# Patient Record
Sex: Female | Born: 1970 | Race: White | Hispanic: No | Marital: Married
Health system: Southern US, Academic
[De-identification: ages and names within clinical notes are randomized; demographics above are authoritative.]

---

## 1997-07-10 ENCOUNTER — Other Ambulatory Visit (HOSPITAL_COMMUNITY): Payer: Self-pay

## 2015-10-21 ENCOUNTER — Inpatient Hospital Stay (HOSPITAL_COMMUNITY)
Admit: 2015-10-21 | Discharge: 2015-10-21 | Disposition: A | Discharge status: Home / Self Care | Payer: Self-pay | Admitting: Radiology

## 2018-01-02 ENCOUNTER — Other Ambulatory Visit (HOSPITAL_COMMUNITY): Payer: Self-pay

## 2018-01-02 DIAGNOSIS — Z1231 Encounter for screening mammogram for malignant neoplasm of breast: Secondary | ICD-10-CM

## 2018-01-15 ENCOUNTER — Other Ambulatory Visit (HOSPITAL_COMMUNITY): Payer: Self-pay | Admitting: NURSE PRACTITIONER

## 2018-01-15 ENCOUNTER — Ambulatory Visit
Admission: RE | Admit: 2018-01-15 | Discharge: 2018-01-15 | Disposition: A | Discharge status: Home / Self Care | Payer: Medicare (Managed Care) | Source: Ambulatory Visit | Attending: NURSE PRACTITIONER | Admitting: NURSE PRACTITIONER

## 2018-01-15 ENCOUNTER — Encounter (HOSPITAL_BASED_OUTPATIENT_CLINIC_OR_DEPARTMENT_OTHER): Payer: Self-pay

## 2018-01-15 DIAGNOSIS — Z1231 Encounter for screening mammogram for malignant neoplasm of breast: Secondary | ICD-10-CM | POA: Insufficient documentation

## 2018-02-05 ENCOUNTER — Other Ambulatory Visit (HOSPITAL_COMMUNITY): Payer: Self-pay

## 2021-01-04 IMAGING — MR MRI CERVICAL SPINE WITHOUT CONTRAST
6 of 7 series · 28 of 48 positions shown · IV contrast (gadolinium)
Comparison: MRI dated 03/24/2018.

﻿EXAM:  63878   MRI CERVICAL SPINE WITHOUT CONTRAST
INDICATION: Neck pain with left upper extremity radiculopathy. History of prior surgery.
TECHNIQUE: Multiplanar multisequential MRI of the cervical spine was performed without gadolinium contrast.

[Series 4: s-map · sagittal · 8.8mm · 4.38mm/px · 8 of 100 slices shown]
[im 5/100]
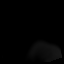
[im 18/100]
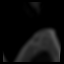
[im 31/100]
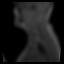
[im 44/100]
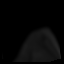
[im 56/100]
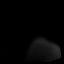
[im 69/100]
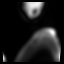
[im 82/100]
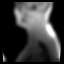
[im 95/100]
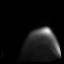

[Series 5: T2 · sagittal · 3.0mm · 0.75mm/px · 4 of 15 slices shown (1 of 2)]
[im 1/15]
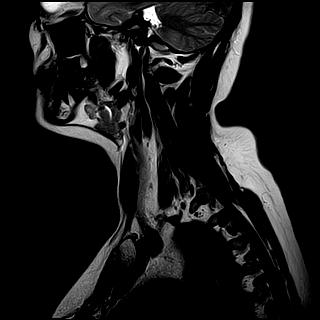
[im 5/15]
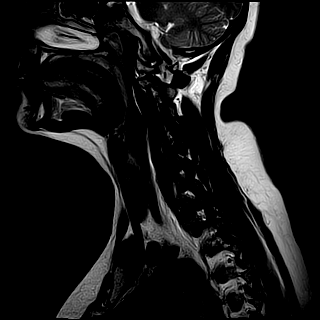
[im 10/15]
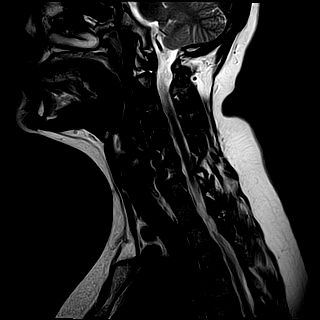
[im 15/15]
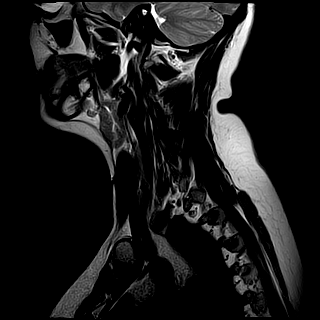

[Series 6: T1 · sagittal · 3.0mm · 0.47mm/px · 4 of 15 slices shown]
[im 1/15]
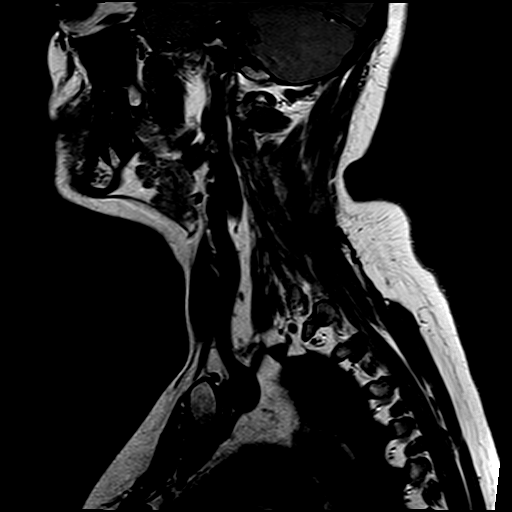
[im 5/15]
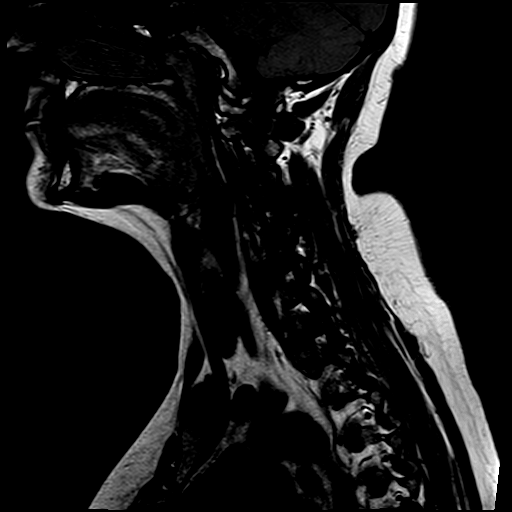
[im 10/15]
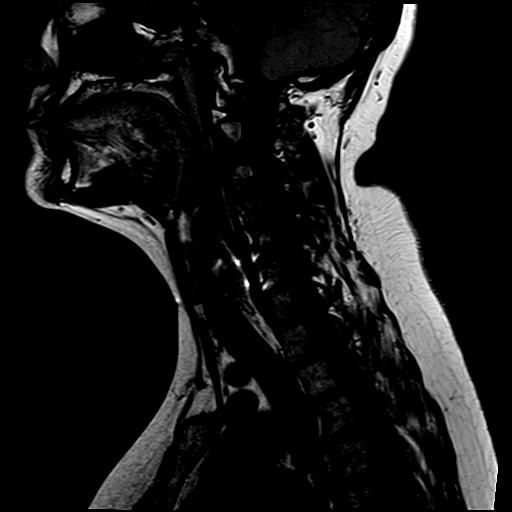
[im 15/15]
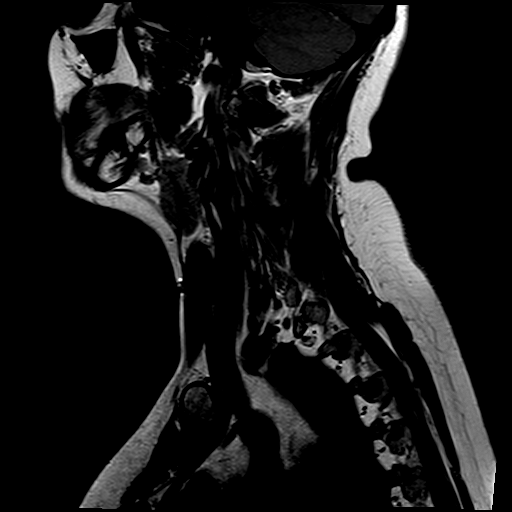

[Series 7: STIR · sagittal · 3.0mm · 0.47mm/px · 4 of 15 slices shown]
[im 1/15]
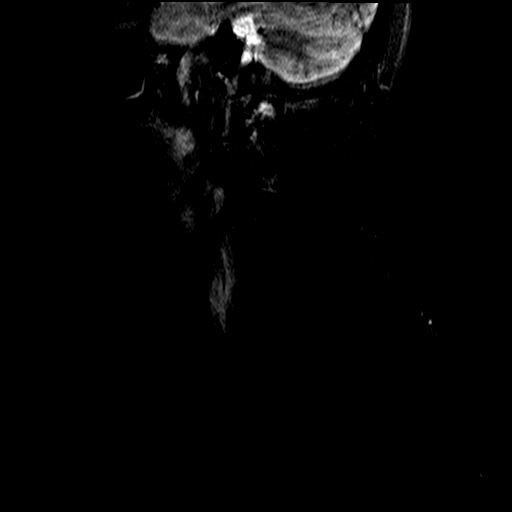
[im 5/15]
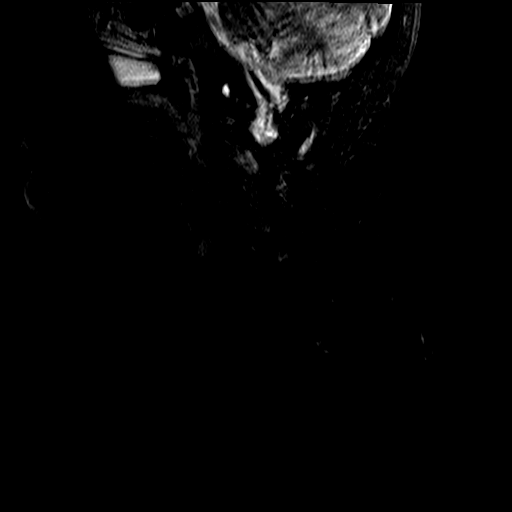
[im 10/15]
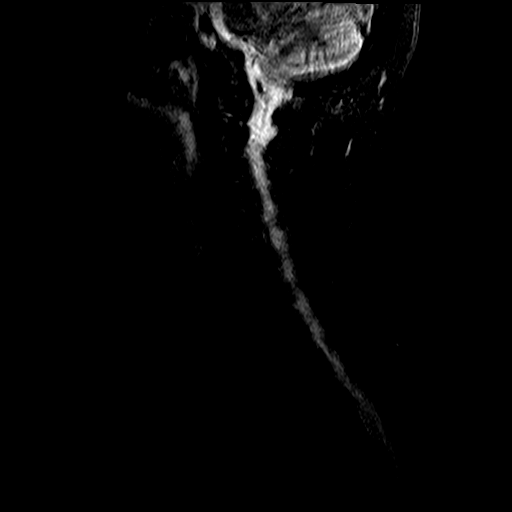
[im 15/15]
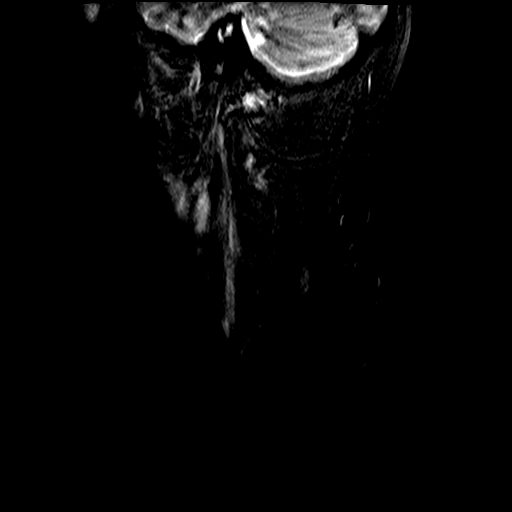

[Series 9: T2-star · axial · 3.0mm · 0.39mm/px · z∈[-53,+31]mm · 4 of 18 slices shown]
[im 1/18]
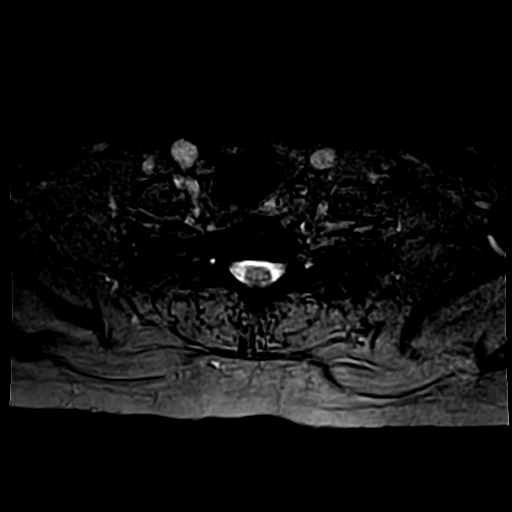
[im 6/18]
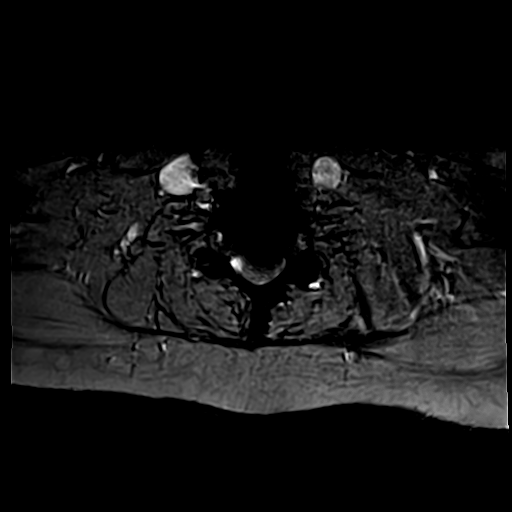
[im 12/18]
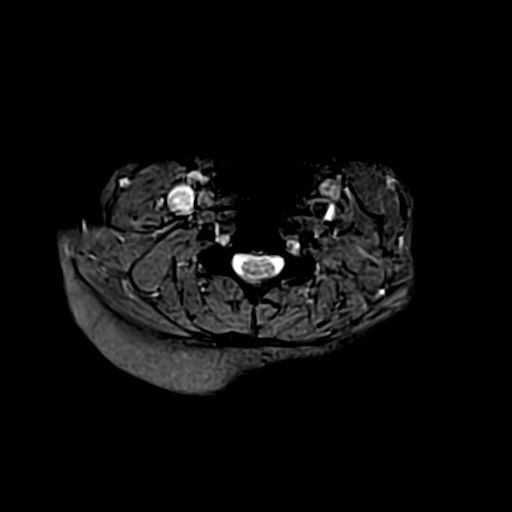
[im 18/18]
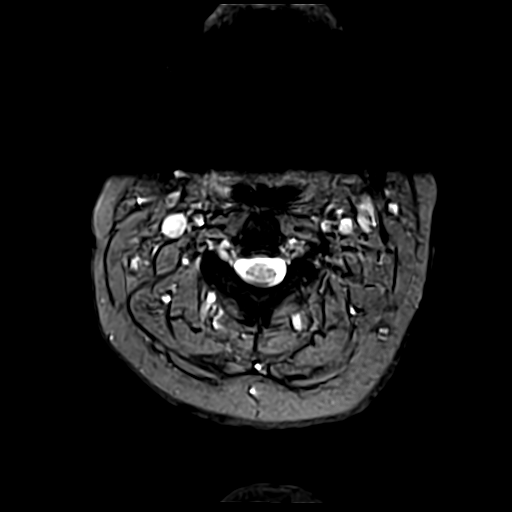

[Series 10: T2 · axial · 3.0mm · 0.39mm/px · z∈[-53,+31]mm · 4 of 18 slices shown (2 of 2)]
[im 1/18]
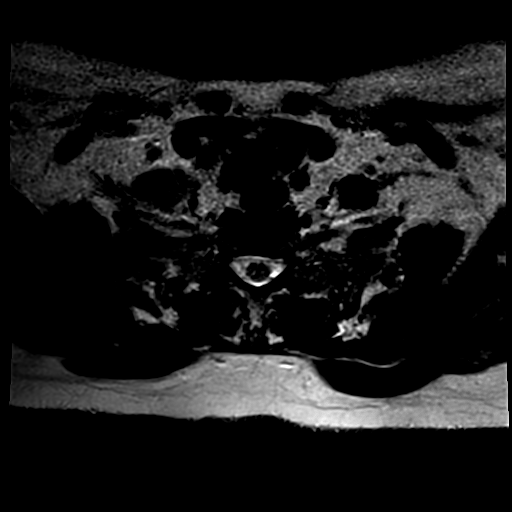
[im 6/18]
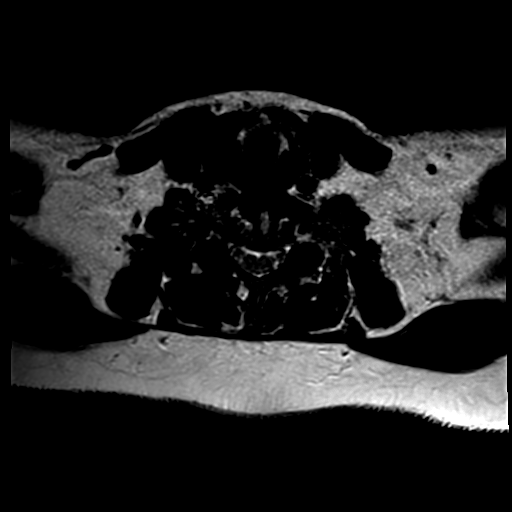
[im 12/18]
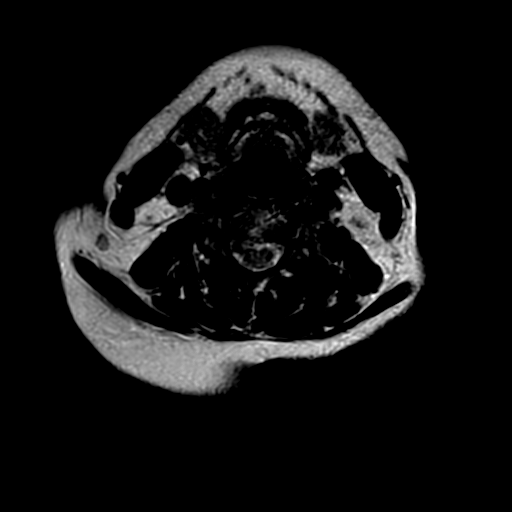
[im 18/18]
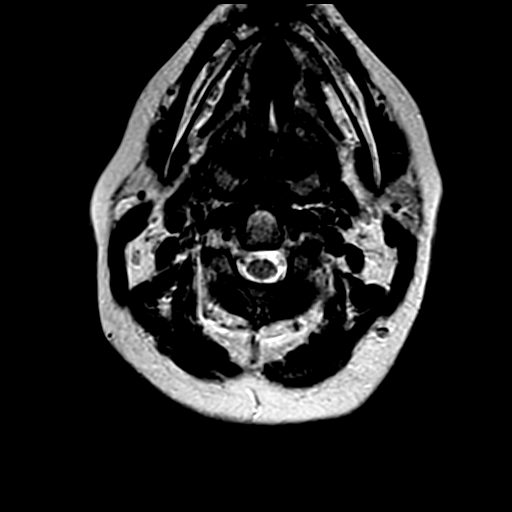

[28 of 48 positions shown; findings below may reference images not displayed]

FINDINGS: Examination is somewhat degraded due to excessive patient motion. Straightening of cervical lordosis is likely positional. Bone marrow signal intensity is normal. There is no acute fracture or subluxation. Anterior fusion of C5 and C6 vertebral bodies is again noted with metallic plate and screws. Visualized spinal cord is also normal in signal intensity without evidence of compression at any level.

C2-3 level is unremarkable.

At C3-4 level, there is a small broad-based central disc osteophyte complex with near complete effacement of the ventral CSF. There is severe left and mild-to-moderate right neural foraminal stenosis from facet and uncovertebral joint hypertrophy.

At C4-5 level, there is a minimal bulging annulus, minimally effacing the ventral CSF. There is mild right neural foraminal stenosis from facet arthropathy.

At C5-6 level, moderate to severe right neural foraminal stenosis from facet and uncovertebral joint hypertrophy is again identified.

At C6-7 level, there is a minimal bulging annulus, minimally effacing the ventral CSF. There is no significant neural foraminal stenosis.

C7-T1 level and paraspinal soft tissues are unremarkable.
IMPRESSION: 1. Stable anterior fusion of C5 and C6 vertebral bodies. 

2. Small disc osteophyte complexes C3-4 level with near complete effacement of the ventral CSF. 

3. Multilevel neural foraminal stenosis as detailed above.

## 2021-06-14 NOTE — Progress Notes (Signed)
PROCEDURE     1. A  left L5-S1 transforaminal epidural injection under fluoroscopic guidance.     2. Use of fluoroscopy was required to insure adequate delivery of medication into the epidural space and around the spinal nerve.     The patient was monitored with continuous pulse oximetry during the procedure.     DIAGNOSIS/INDICATIONS FOR THE PROCEDURE: Patient with LBP and radiculitis. Due to their pain they were set up for this injection. For specific information please refer to their history and physical and previous evaluation.  Patient does have severe foraminal stenosis correlates with her left leg pain.    IMPRESSION/RESULTS: The patient tolerated the procedure well without any complications.    FOLLOW-UP APPOINTMENT: With [place] in a few weeks.  If she has any problems and contact us.    INFORMED CONSENT: The patient understood the potential risks and benefits of the procedure which were explained to the patient prior to the procedure. The patient read and signed the consent stating complete understanding of this information, and wished to proceed with the procedure. Ample time was given for any questions to be answered prior to the procedure. The risks of the procedure were explained including, but not limited to the risk of bleeding and/or infection into the epidural space, disc or spine, nerve injury, nerve irritation, reaction to medications, etc. The patient denied any history of bleeding disorders or allergies to the medications being used or other medical contraindications to the procedure. No promises were given to any expected outcome.      PROCEDURE: A timeout was performed. The patient was sterilely prepped and draped with a triple scrub of betadine solution in the prone position. Careful aseptic technique was used throughout the procedure. The neuro-foramen was identified under fluoroscopic guidance using an oblique posterior-lateral approach. The skin and subcutaneous tissues were anesthetized with  approximately 2-3 cc. of 1% Xylocaine. Then, a 25 gauge spinal needle was inserted down into the foramen. A small amount of Isovue M-200 (approximately 1-3 cc) was infiltrated under real time fluoroscopy, which eventually demonstrated satisfactory spread along the spinal nerve and tracking into the epidural space at that level. No arterial or venous flow was noted during the injection of the contrast prior to injecting any steroid or anesthetic and no aspirate was noted. Spot films were taken. Then, a solution containing 1.5 cc of Dexamethasone (10mg /cc, preservative free) mixed with 1.5 cc of 1% Lidocaine was slowly infiltrated around the nerve and into the epidural space. Under real time fluoroscopy, there was good spread and eventual washout of contrast. The needle was then removed and a Band-Aid was applied. The patient tolerated the procedure well without any complications.  Procedures were performed by Dr. and my self.      DISCHARGE SUMMARY: The patient was monitored for at least 10 minutes where they remained stable without any evidence of complications. The patient was discharged with discharge instructions in stable condition. If the patient has any problems, they were instructed to contact Meyer Russel.    [place], MD             Electronically signed by: Delaine Lame., MD  06/14/21 1331

## 2021-07-06 IMAGING — US US RT BREAST COMPLETE- 4 QUADRANTS PLUS AXILLA
1 series · 13 of 25 positions shown · non-contrast
Comparison: 02/06/2019

﻿

3D DX MAMMO BIL AND TOMO,US RT BREAST COMPLETE- 4 QUADRANTS PLUS AXILLA
EXAM:  3D BILATERAL DIAGNOSTIC DIGITAL MAMMOGRAM WITH TOMOSYNTHESIS AND COMPLETE RIGHT BREAST ULTRASOUND
INDICATION: Palpable lump medial right breast.

[Series 1: us right breast complete- 4 quadrants plus axilla · 13 of 40 slices shown]
[im 1/40]
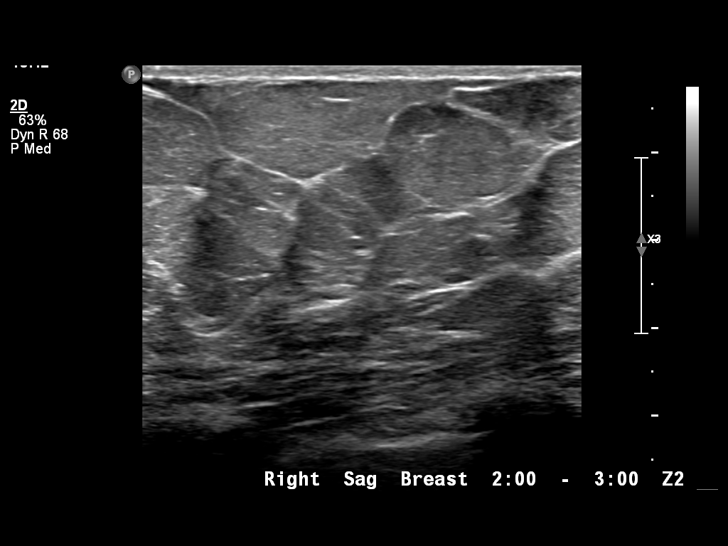
[im 4/40]
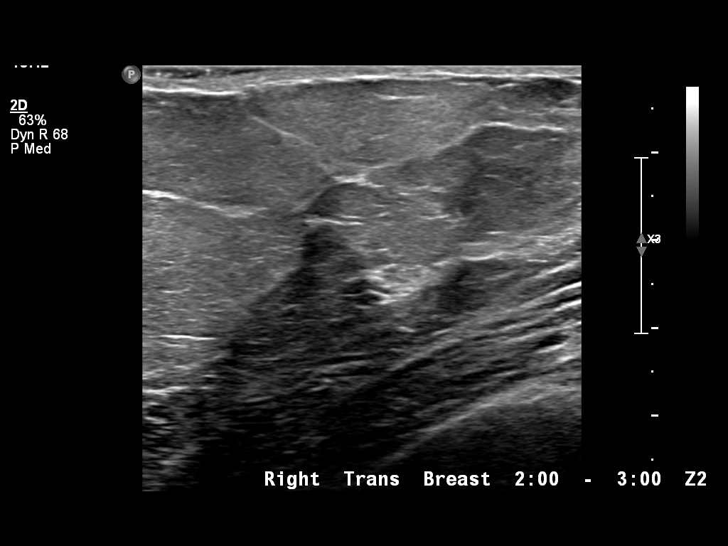
[im 7/40]
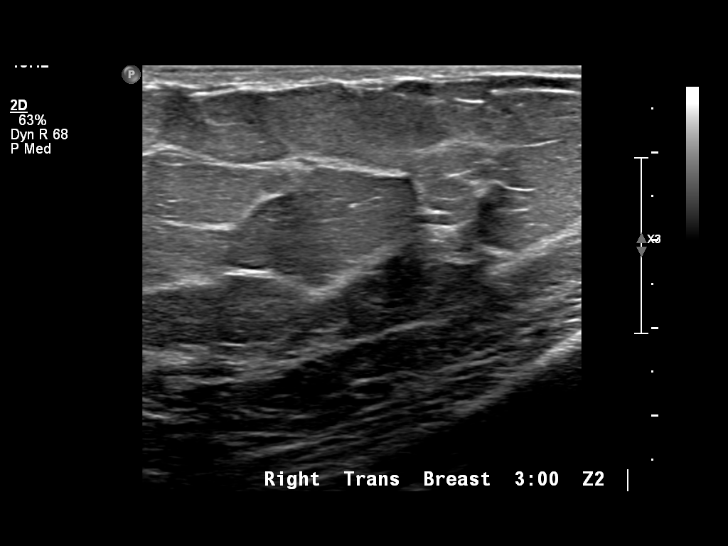
[im 10/40]
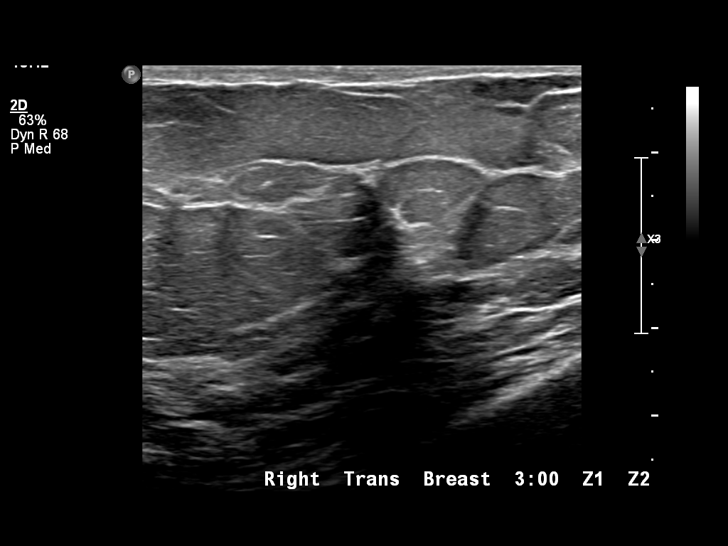
[im 14/40]
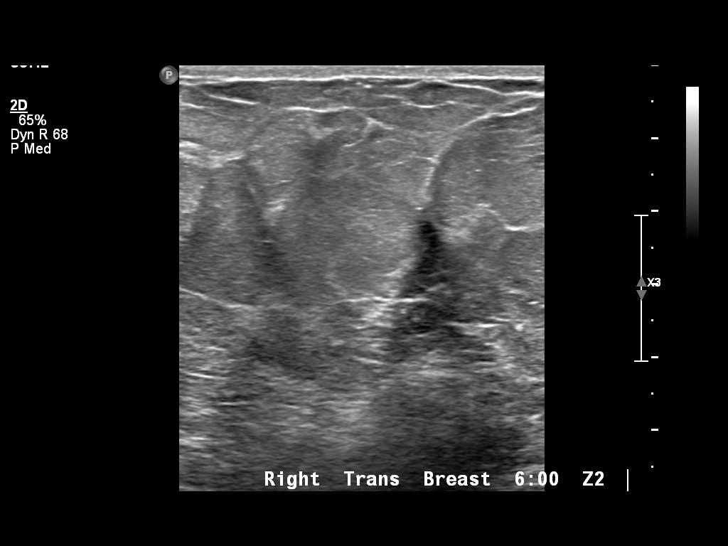
[im 17/40]
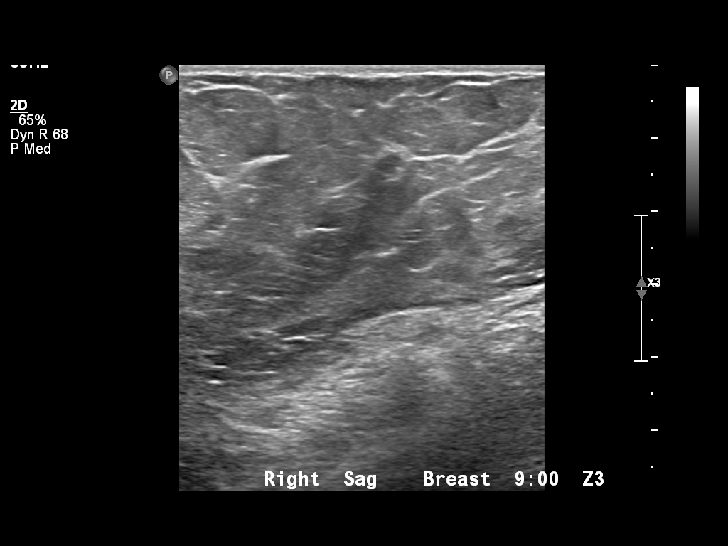
[im 20/40]
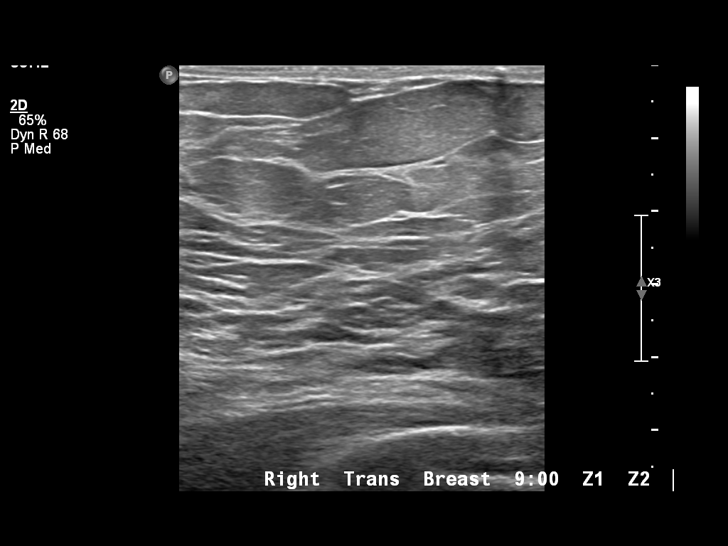
[im 23/40]
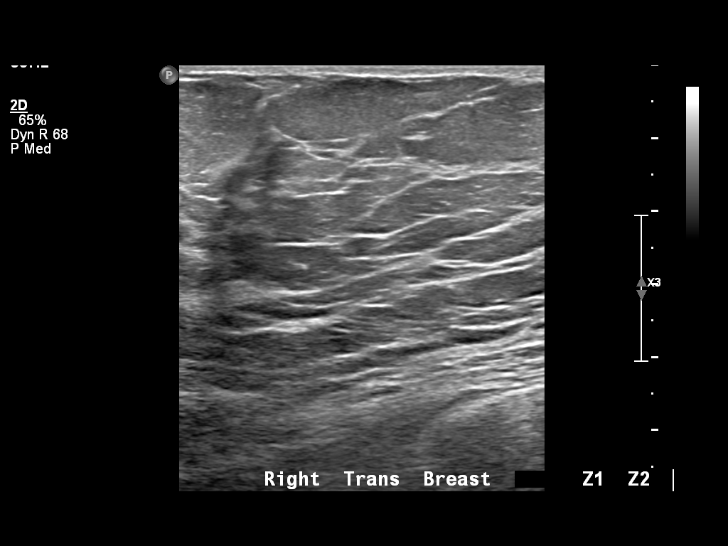
[im 27/40]
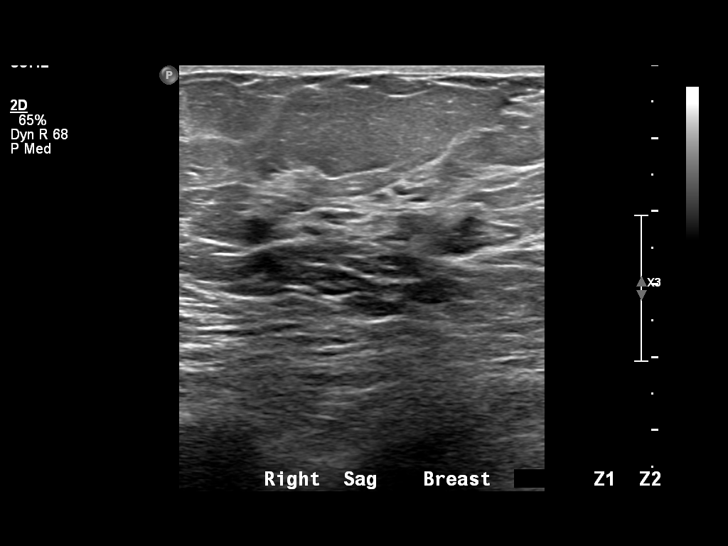
[im 30/40]
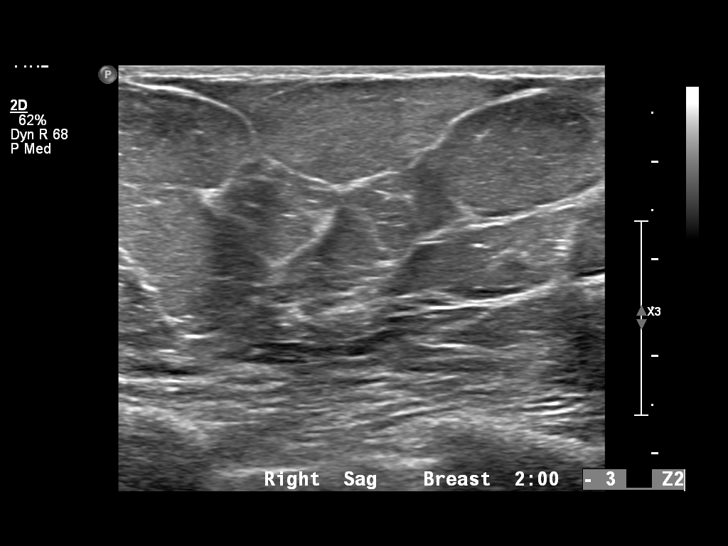
[im 33/40]
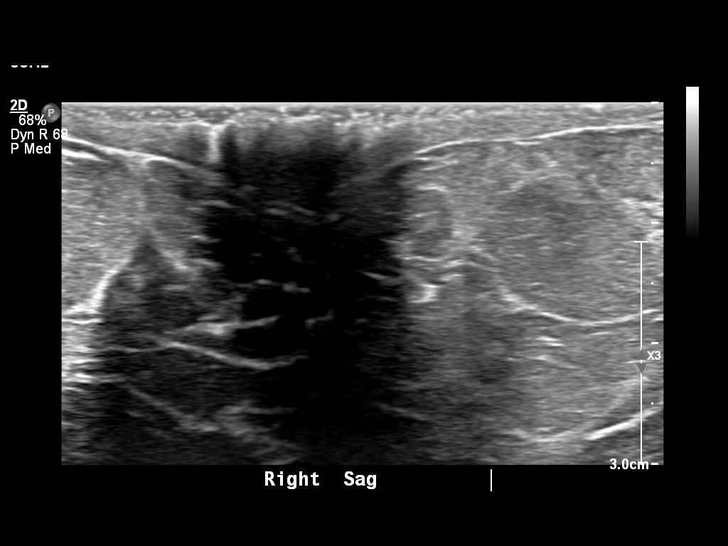
[im 36/40]
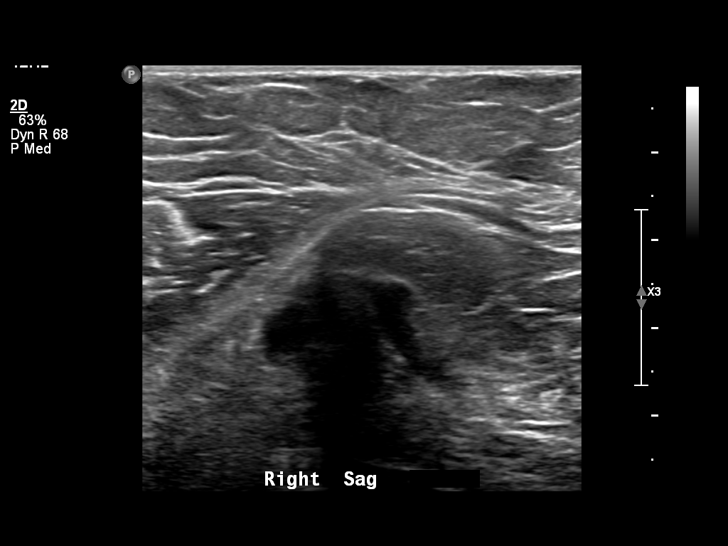
[im 40/40]
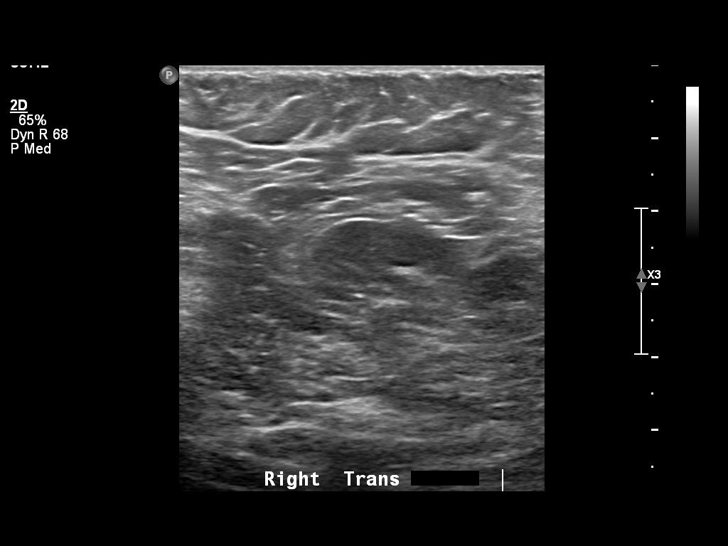

[13 of 25 positions shown; findings below may reference images not displayed]

FINDINGS: Breast parenchyma is heterogeneously dense.  There is no mass or suspicious cluster of microcalcifications.  There is no architectural distortion, skin thickening or nipple retraction. 

A comprehensive sonographic evaluation of the right breast was also performed and representative images of all four quadrants, retroareolar and axillary regions were obtained.  There is no suspicious solid or cystic mass.  There is no axillary adenopathy.
IMPRESSION: 1.  BIRADS 2-Benign findings. Patient has been added in a reminder system with a target date for the next screening mammography.

2.  DENSITY CODE – C (Heterogeneously dense).  

Final Assessment Code:

Bi-Rads 2 

BI-RADS 0
 Need additional imaging evaluation.

BI-RADS 1
 Negative mammogram.

BI-RADS 2
 Benign finding.

BI-RADS 3
 Probably benign finding; short-interval follow-up suggested.

BI-RADS 4
 Suspicious abnormality; biopsy should be considered.

BI-RADS 5
 Highly suggestive of malignancy; appropriate action should be taken.

BI-RADS 6
 Known biopsy-proven malignancy; appropriate action should be taken.

NOTE:
In compliance with Federal regulations, the results of this mammogram are being sent to the patient.

## 2021-07-06 IMAGING — MG 3D DX MAMMO BIL AND TOMO
5 series · 7 of 24 positions shown · non-contrast
Comparison: 01/29/2020

------------- REPORT [id number] -------------
[facility]
[address]
Tiger Ms.OLINGA, MOKSIA:
We wish to report the following on your recent mammography examination. We are sending a report to your referring physician or other health care provider. 
(       Normal/Negative:
No evidence of cancer.
This statement is mandated by the [place], Department of Health.
Your examination was performed by one of our technologists, who are registered radiological technologists and also specially certified in mammography:
___
[place], Marjuan (M)

Your mammogram was interpreted by our radiologist.
( 
Collette Sedman, M.D.
(Annual Breast Examination by a physician or other health care provider
(Annual Mammography Screening beginning at age 40
(Monthly Breast Self Examination
------------- REPORT [id number] -------------
﻿
3D DX MAMMO BIL AND TOMO,US RT BREAST COMPLETE- 4 QUADRANTS PLUS AXILLA
EXAM:  3D BILATERAL DIAGNOSTIC DIGITAL MAMMOGRAM WITH TOMOSYNTHESIS AND COMPLETE RIGHT BREAST ULTRASOUND
INDICATION: Palpable lump medial right breast.

[R]
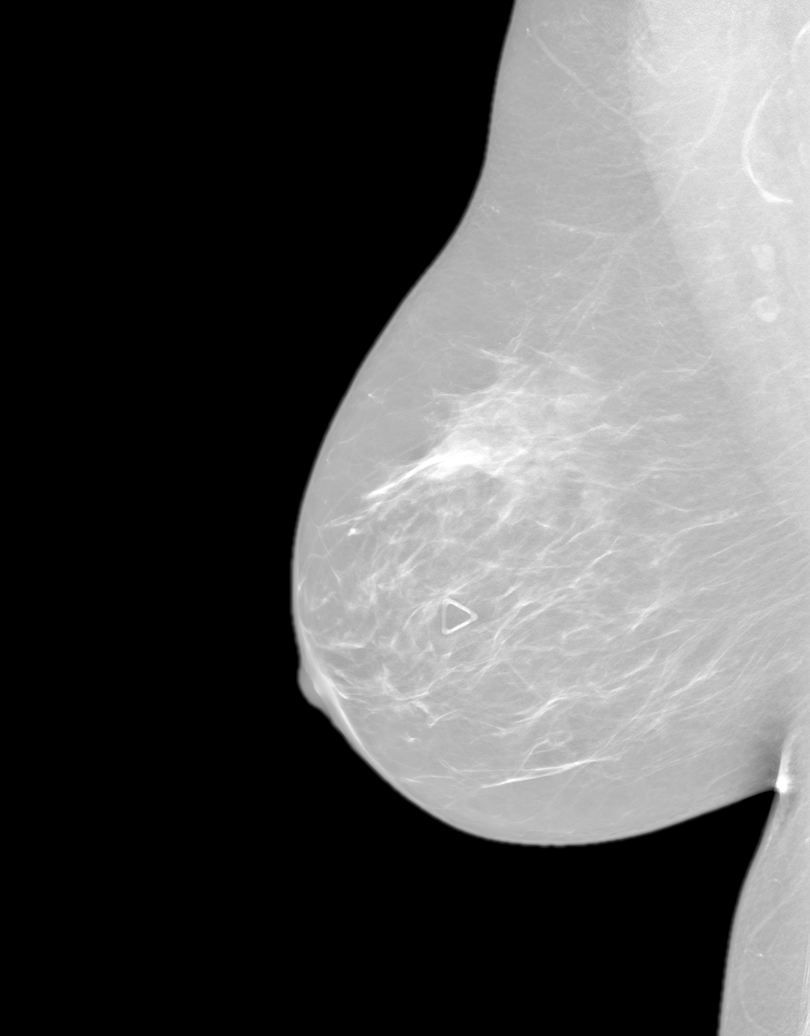

[L]
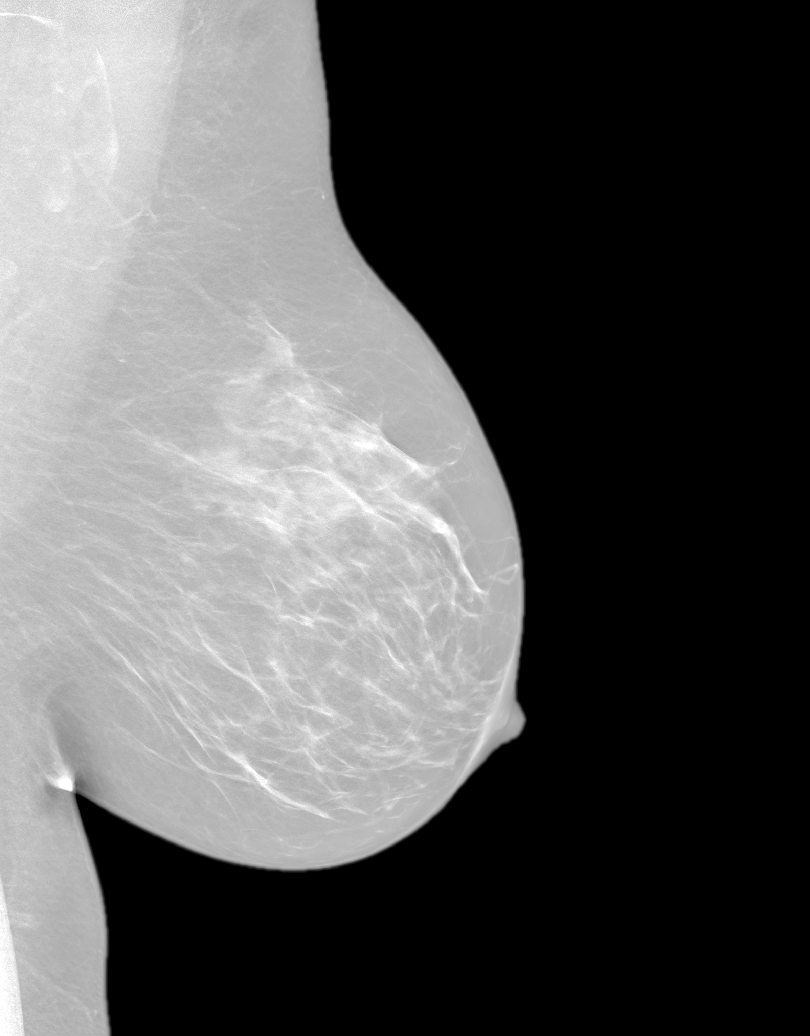

[R CC tomo · right · 0.10mm/px · 2 of 2 slices shown]
[im 1/2]
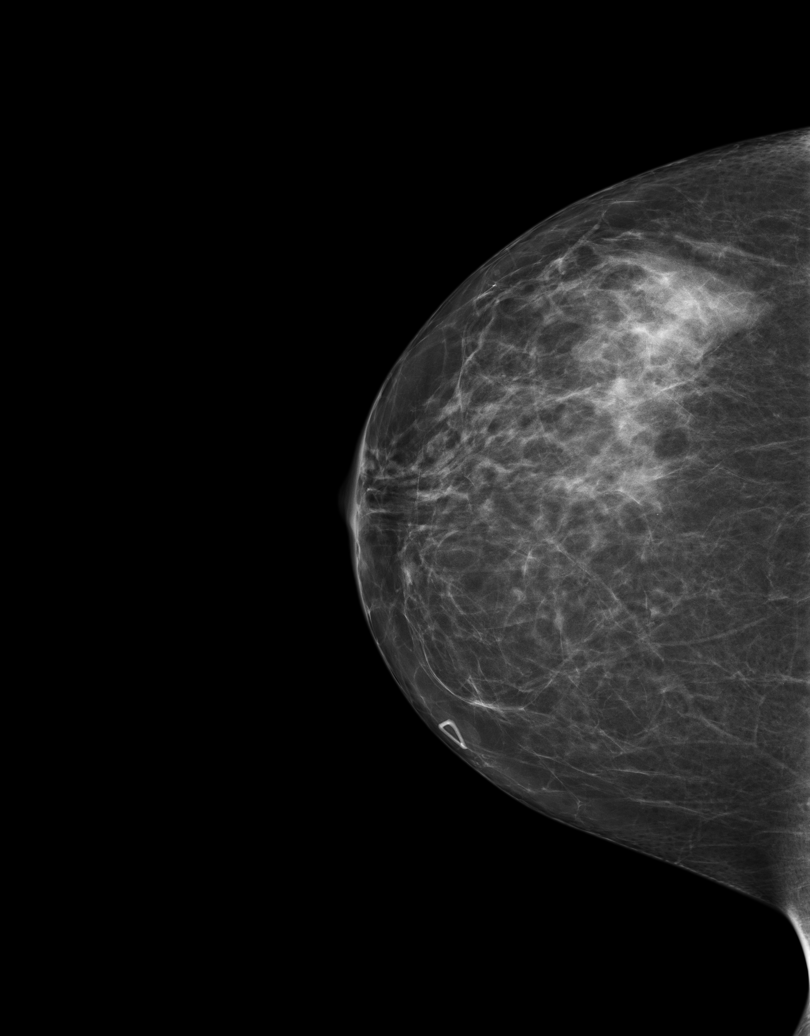
[im 2/2]
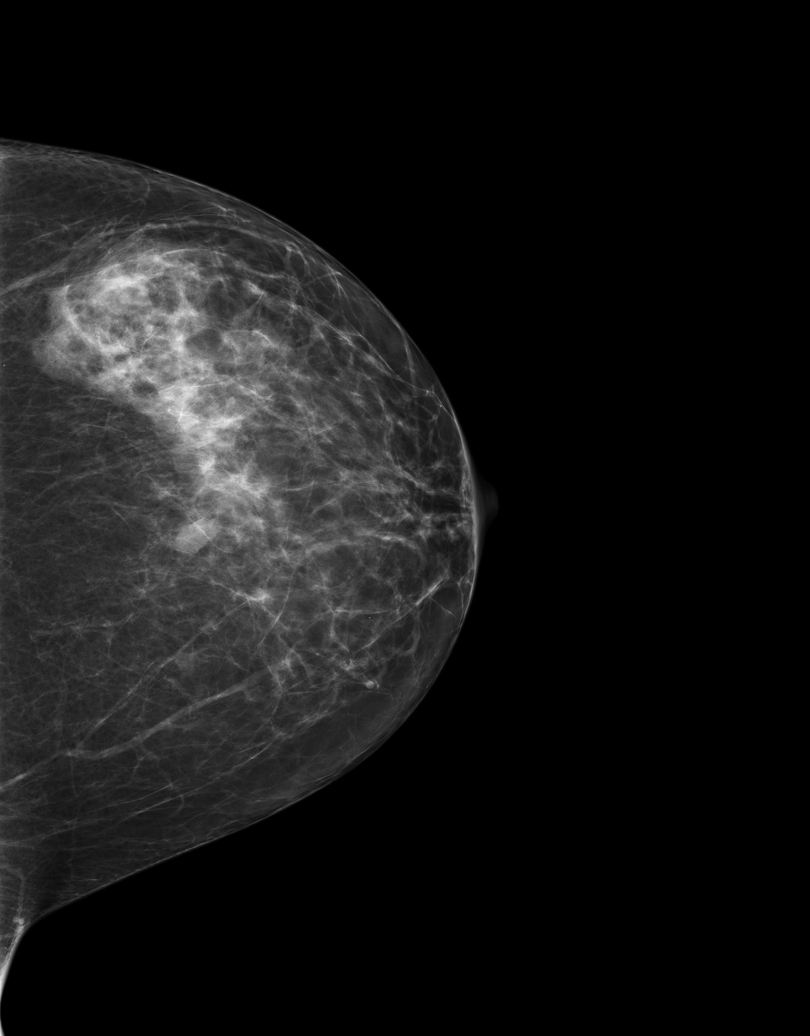

[3D DX MAMMO BIL AND TOMO tomo · 2 acquisitions, 2 frames shown (1 of 2)]
[im 1/2]
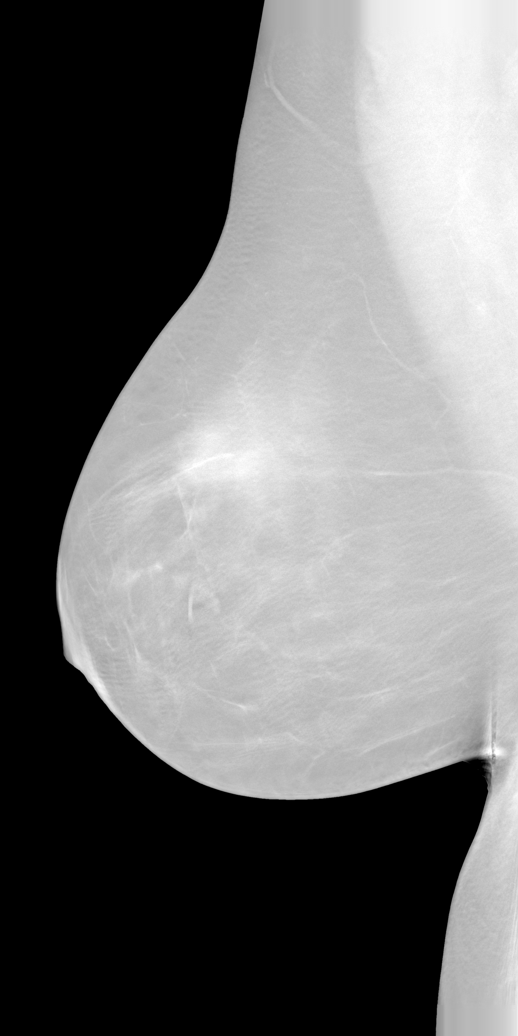
[im 2/2]
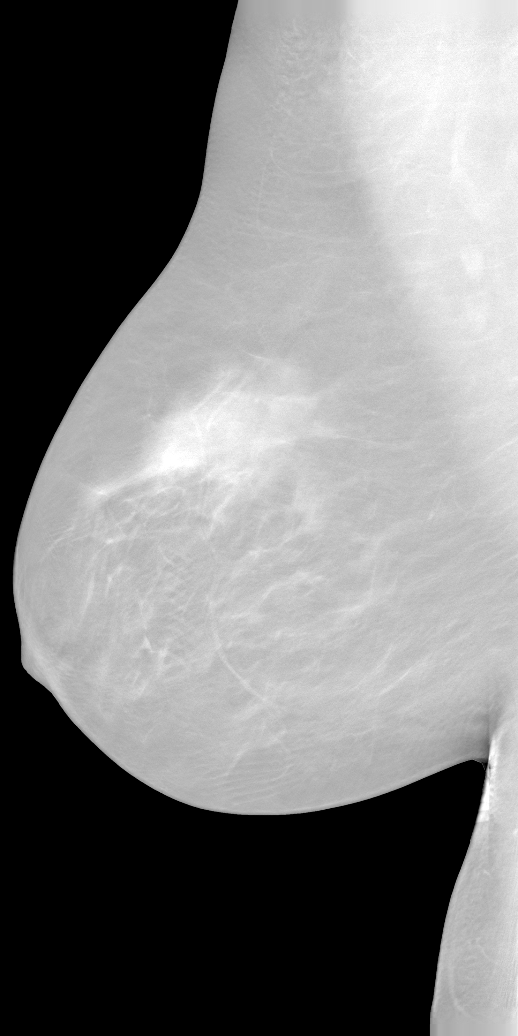

[3D DX MAMMO BIL AND TOMO tomo (2 of 2) · tomo slice 14/86.0]
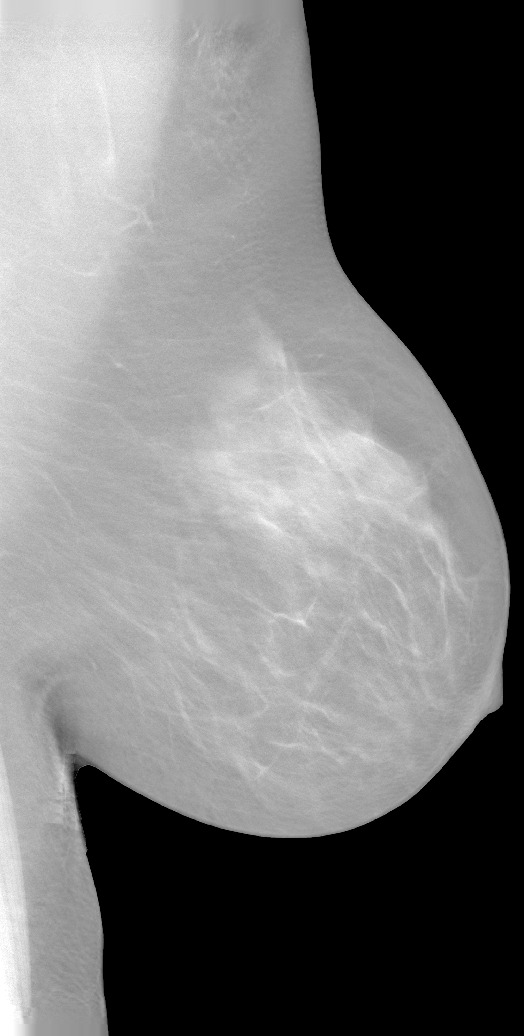

[7 of 24 positions shown; findings below may reference images not displayed]

FINDINGS: Breast parenchyma is heterogeneously dense.  There is no mass or suspicious cluster of microcalcifications.  There is no architectural distortion, skin thickening or nipple retraction. 

A comprehensive sonographic evaluation of the right breast was also performed and representative images of all four quadrants, retroareolar and axillary regions were obtained.  There is no suspicious solid or cystic mass.  There is no axillary adenopathy.
IMPRESSION: 1.  BIRADS 2-Benign findings. Patient has been added in a reminder system with a target date for the next screening mammography.

2.  DENSITY CODE – C (Heterogeneously dense).  

Final Assessment Code:

Bi-Rads 2 

BI-RADS 0
 Need additional imaging evaluation.

BI-RADS 1
 Negative mammogram.

BI-RADS 2
 Benign finding.

BI-RADS 3
 Probably benign finding; short-interval follow-up suggested.

BI-RADS 4
 Suspicious abnormality; biopsy should be considered.

BI-RADS 5
 Highly suggestive of malignancy; appropriate action should be taken.

BI-RADS 6
 Known biopsy-proven malignancy; appropriate action should be taken.

NOTE:
In compliance with Federal regulations, the results of this mammogram are being sent to the patient.

## 2021-08-05 ENCOUNTER — Telehealth (INDEPENDENT_AMBULATORY_CARE_PROVIDER_SITE_OTHER): Payer: Self-pay | Admitting: Surgery

## 2021-08-05 ENCOUNTER — Other Ambulatory Visit (INDEPENDENT_AMBULATORY_CARE_PROVIDER_SITE_OTHER): Payer: Self-pay | Admitting: Surgery

## 2021-08-05 MED ORDER — SUCRALFATE 1 GRAM TABLET
1.00 g | ORAL_TABLET | Freq: Two times a day (BID) | ORAL | 3 refills | Status: DC
Start: 2021-08-05 — End: 2021-11-22

## 2021-11-21 ENCOUNTER — Telehealth (INDEPENDENT_AMBULATORY_CARE_PROVIDER_SITE_OTHER): Payer: Self-pay | Admitting: Surgery

## 2021-11-21 NOTE — Telephone Encounter (Signed)
Refill request for Carafate. Lov 03/24/21. Okay to refill? Ma Rings, LPN  1/59/4585 92:92

## 2021-11-22 ENCOUNTER — Other Ambulatory Visit (INDEPENDENT_AMBULATORY_CARE_PROVIDER_SITE_OTHER): Payer: Self-pay | Admitting: NURSE PRACTITIONER

## 2021-11-22 MED ORDER — SUCRALFATE 1 GRAM TABLET
1.0000 g | ORAL_TABLET | Freq: Two times a day (BID) | ORAL | 2 refills | Status: AC
Start: 2021-11-22 — End: ?

## 2021-11-22 NOTE — Telephone Encounter (Signed)
Refill sent informed patient Ma Rings, LPN  1/97/5883 25:49

## 2022-02-20 IMAGING — DX XRAY SHOULDER MINIMUM 2 VIEW RT
1 series · 3 of 3 positions shown · non-contrast
Comparison: None available.

﻿EXAM:  11919   XRAY SHOULDER MINIMUM 2 VIEW RT
INDICATION: Right shoulder pain, decreased range of motion.

[Series 1: apobl · 0.14mm/px · 3 of 3 slices shown]
[im 1/3]
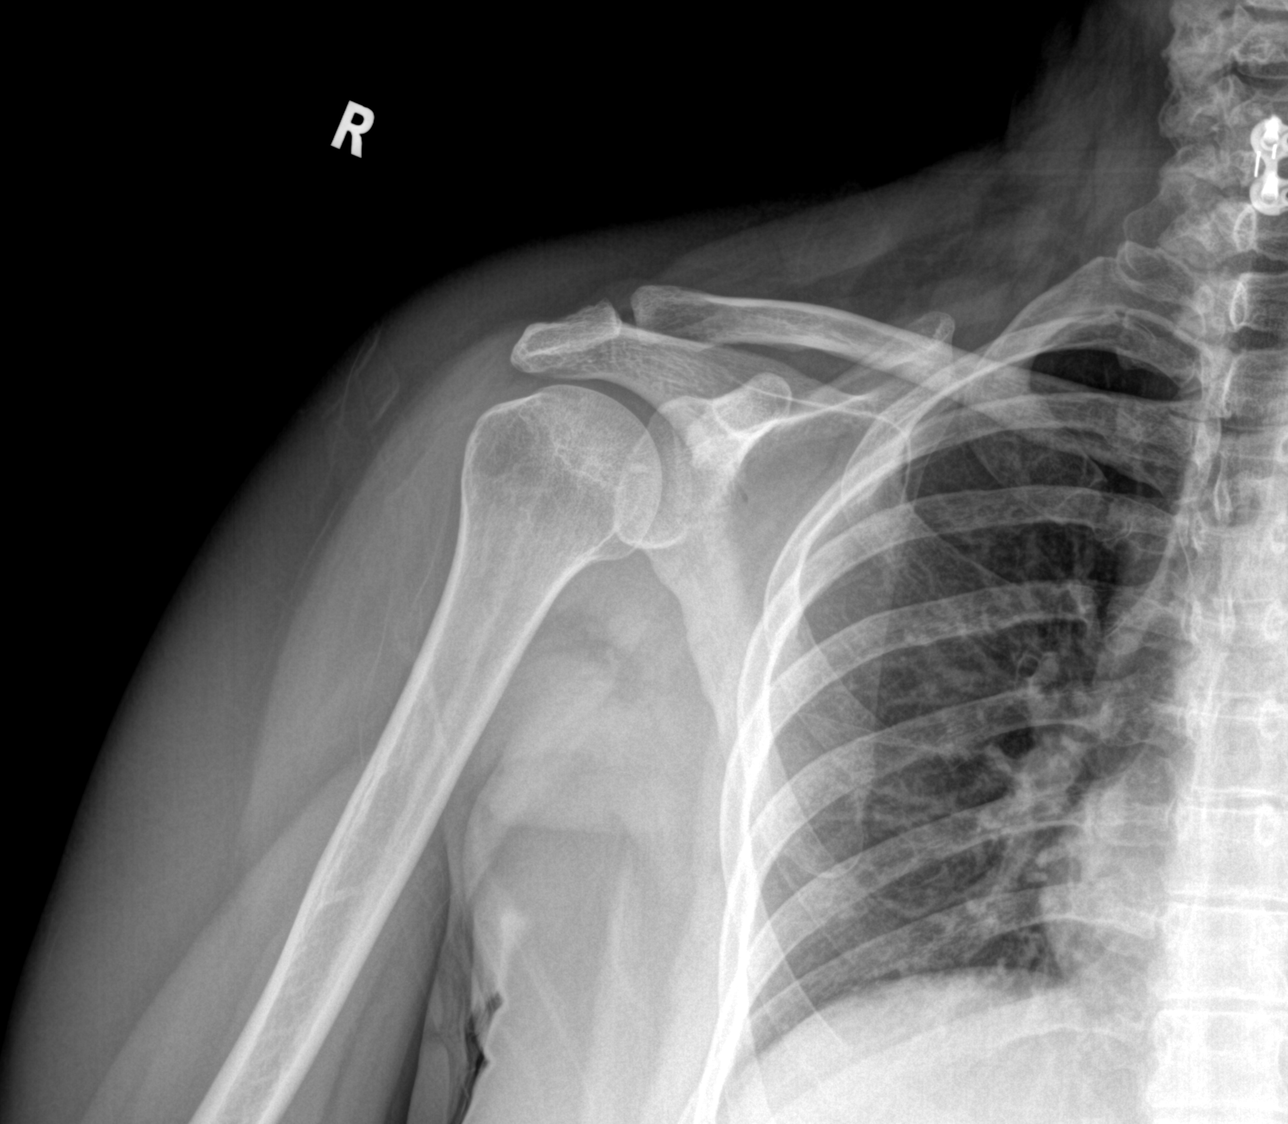
[im 2/3]
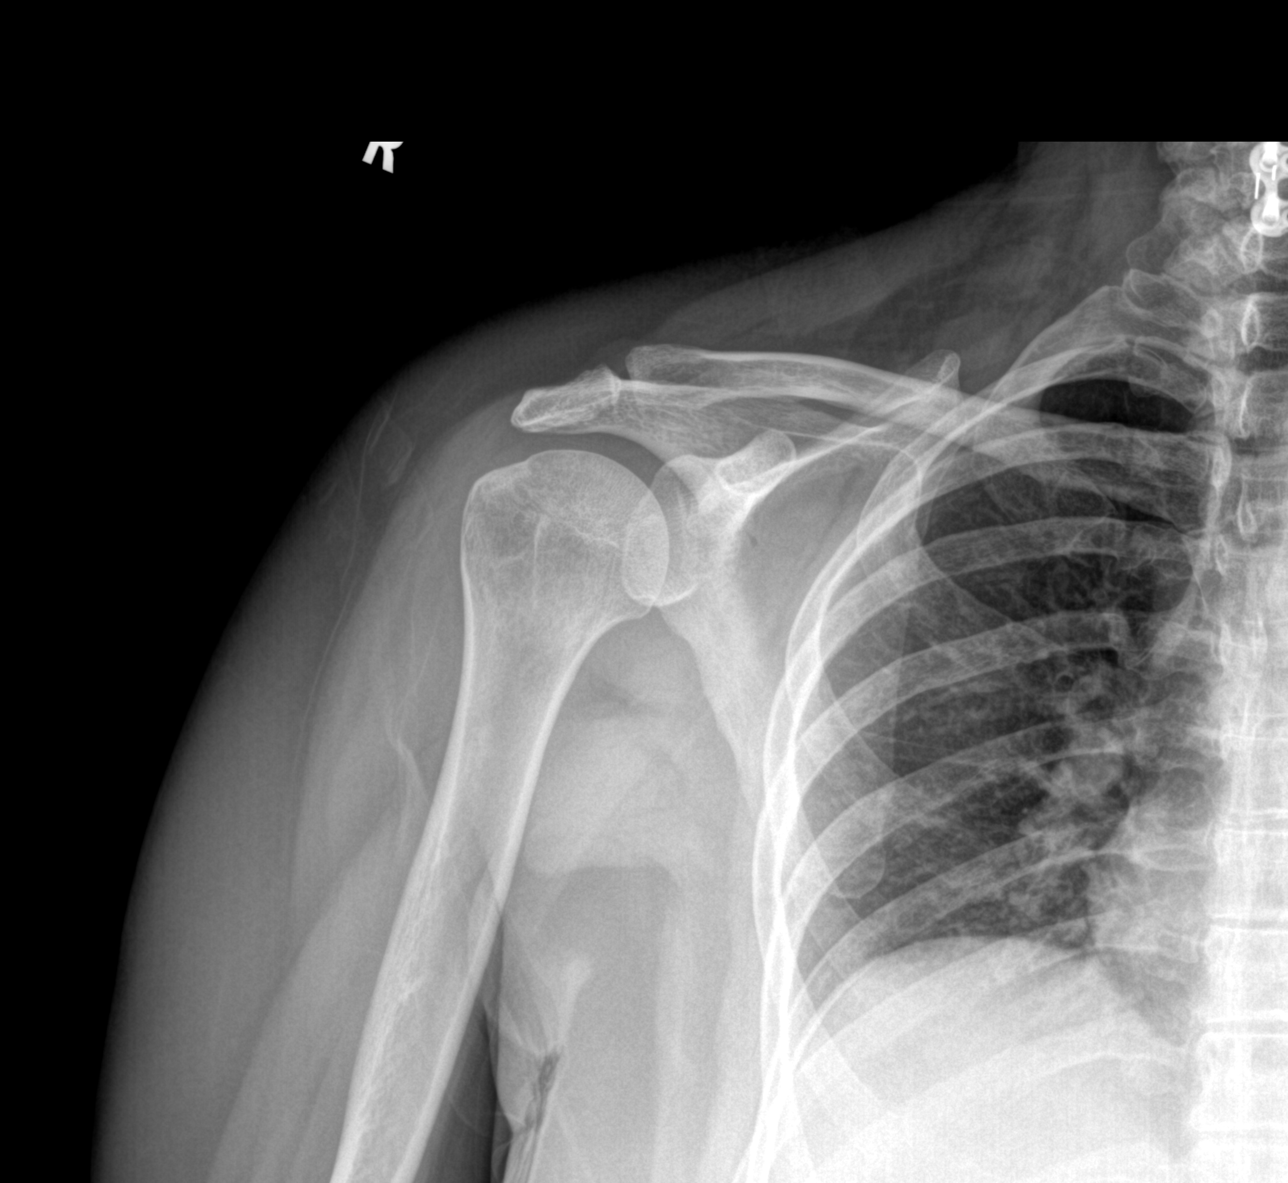
[im 3/3]
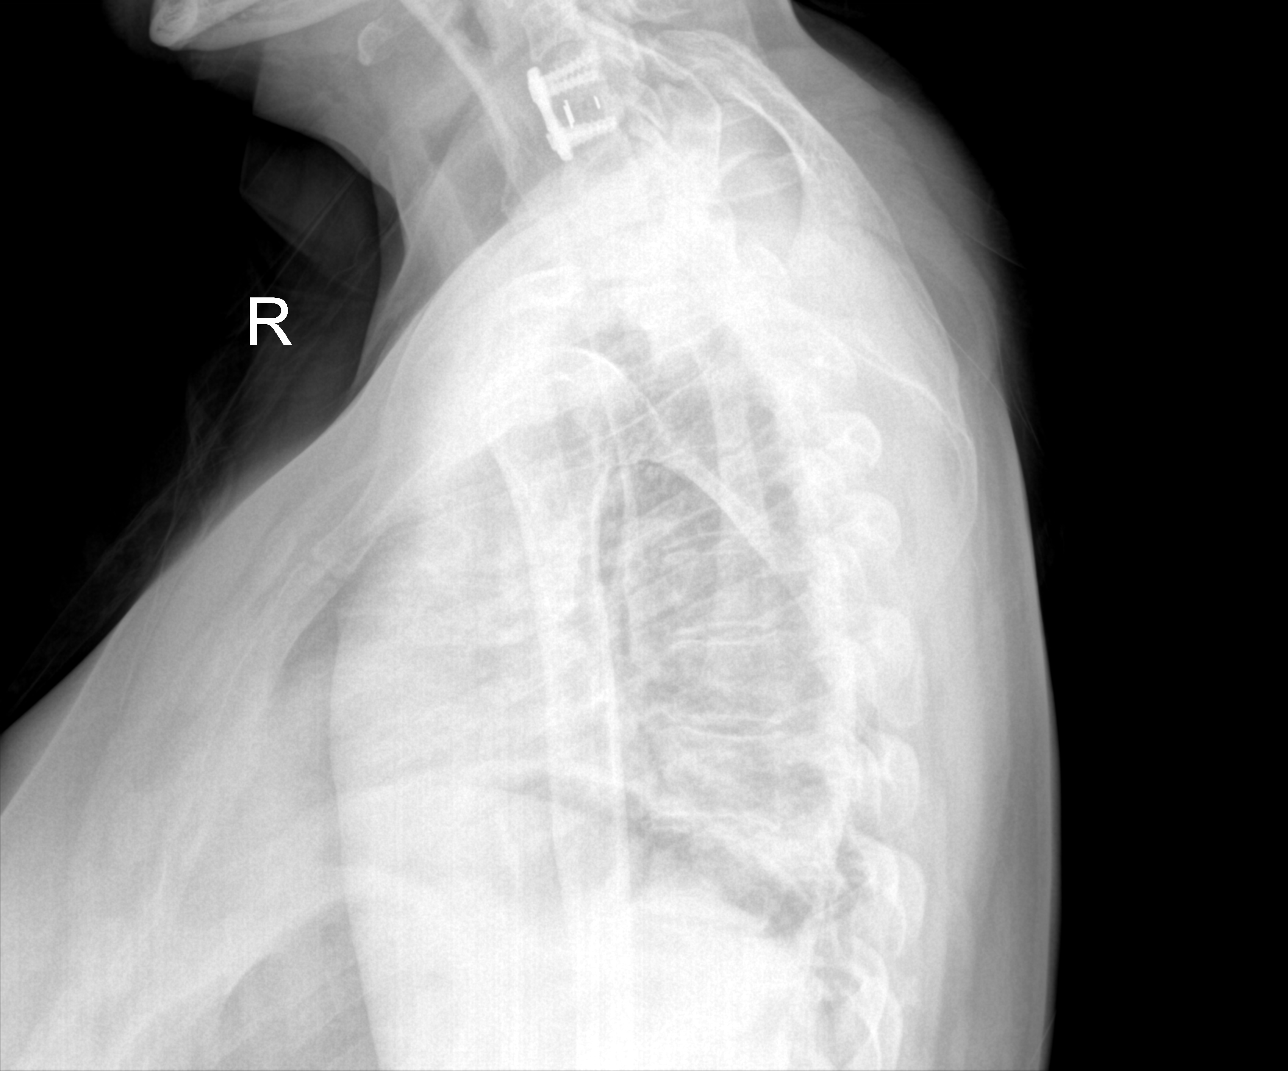

[3 of 3 positions shown; findings below may reference images not displayed]

FINDINGS: Three views demonstrate no fracture, dislocation, significant degenerative change, or soft tissue calcification.
IMPRESSION: Normal examination.

## 2022-04-17 ENCOUNTER — Other Ambulatory Visit: Payer: Self-pay

## 2022-04-17 ENCOUNTER — Ambulatory Visit (HOSPITAL_COMMUNITY)
Admission: RE | Admit: 2022-04-17 | Discharge: 2022-04-17 | Disposition: A | Discharge status: Still In Hospital | Payer: Worker's Compensation | Source: Ambulatory Visit | Attending: NURSE PRACTITIONER | Admitting: NURSE PRACTITIONER

## 2022-04-17 DIAGNOSIS — M25511 Pain in right shoulder: Secondary | ICD-10-CM | POA: Insufficient documentation

## 2022-04-17 NOTE — PT Evaluation (Addendum)
[facility]  Outpatient Physical Therapy  [place], [address]  [phone]  [phone]       Physical Therapy Upper Extremity Evaluation    Date: 04/17/2022  Patient's Name: Robyn Kim  Date of Birth: 1971/01/26    PT diagnosis/Reason for Referral: Pain in R shoulder             SUBJECTIVE  Subjective: Patient is a home health nurse. On November 7th 2023 Ercia was leaving a patients home where she missed a step off the porch losing her balance. Patient notes her ankle twisted and she fell on outstretched R arm. Per pt since onset it has progressively worsened noting parasthenia into R hand (all fingers, but most commonly thumb and index finger). Patient was moved to light work duty and currently works at [employer]. Patient is R hand dominant at baseline and notes inability to complete ADL's/IAD's, and household work with R UE due to limitations in ROM and high pain levels. Pt notes significant disruption to sleep quality, and alterations in grip strength.    Mechanism of injury: Work Comp = fall on an outstretched arm.    PMH:   1. C5-C6 fusion in 2019  2. Fibromyalgia 2023  3. Cervical stenosis      Medications for this problem:  Robaxin 1x a day     Diagnostic tests: X-ray- negative for any finding. 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                    Pain description: SHARP, DULL, and TINGLING    Pain frequency:  CONTINUOUS and Various intensity    Pain rating: Now 4/10   Best 4/10   Worst 8/10    Radiculopathy: Yes    Pain increases with: ADLs, ACTIVITY, and LIFTING           decreases with : HEAT and REST    Sleep affected: Significant impact wakes up constantly. Sleeps better in the recliner.    PLOF: Home health nurse with no restriction.    Subjective Functional  Reports:    Sitting: LIMITED and Guards R shoulder in ADD/IR    Standing: LIMITED and Guards R shoulder in ADD/IR    Walking: LIMITED and Guards R shoulder in ADD/IR    Lifting: LIMITED        Patient-Specific Functional Score:    Problem Score = 0 (04/17/22)   1. Fishing 0   2. ADL's (doing hair, washing hair) 0   3. Return to full work duty 0   TOTAL 0     OBJECTIVE    Shoulder AROM   LEFT Right AROM RIGHT PROM (Supine)   Flexion 160 82 107 (empty end feel)   Extension 55 70 70 (empty end feel)   Abduction 155     ER C7 Unable  4 (empty end feel)   IR T7 L5 WFL       Elbow AROM = WFL Bilaterally no reproduction or increase of pain.     right left   Shoulder flexion   4   Shoulder abduction   4   Elbow flexion   4+   Elbow extension   4+  Grip strength   90* elbow bend 8 # 52 #   75* shoulder flexion straight elbow 5# 35#          Strength comment R UE not tested due     Joint mobility Unable to assess patient holds R UE in ADD/IR and very guarded with all transitional movements as well as ambulation.    Palpation: Tenderness along lateral bicep/proximal shoulder in supine. No tenderness elicited to muscle palpation in sitting.    Posture: Moderate rounded shoulders with forward head posture. Patient holds R UE in ADD/IR and very guarded with all transitional movements as well as ambulation.    Special tests: Patient was unable to achieve positions required for shoulder special tests at time of evaluation.  Shoulder special tests:    Cervical screening:  Quadrants = Negative Bilaterally  2. Infraspinatus = Negative but weak    Treatment provided:REVIEW OF POC AND GOALS WITH PATIENT, ALL QUESTIONS ANSWERED, PATIENT EDUCATION, and THERAPEUTIC EXERCISE   Access Code: [email or web address]  URL: [email or web address]  Date: 04/17/2022  Prepared by: Jonell Cluck    Exercises  - Supine Shoulder Flexion Extension AAROM with Dowel  - 1-2 x daily - 7 x weekly - 2 sets - 15 reps  - Supine Shoulder Abduction AAROM with Dowel  -  1-2 x daily - 7 x weekly - 2 sets - 15 reps  - Supine Shoulder External Rotation AAROM with Dowel  - 1-2 x daily - 7 x weekly - 2 sets - 15 reps  - Grip strengthening with yellow putty vs stress ball - intermittently throughout the day (verbal)  - Elbow AROM (supination/pronation, flexion/extension) - intermittently throughout the day (verbal)          ASSESSMENT    Impression: Patient is a 52 y/o referred to PT services following an fall on an outstretched hand while leaving work. PT evaluation notes alterations in paraesthesia of R UE, high pain levels, significant muscle guarding as patient holds R arm in ADD/IR with inability to fully relax for PROM assessment. PT notes significant reduction in grip strength of R dominant hand in comparison to L. Patients R shoulder AROM is 82 degrees flexion and 70 degrees abduction with shakiness as well as N/T when moving into this position PROM to R shoulder indicates empty end feel limited by pain. Special tests for R shoulder was unable be completed due to pt unable to achieve positions required. Patients presentation is consistent with possible RC tear, pt has MRI Friday 04/21/22. The deficits noted above impact patients QOL, ability to complete self care, and limit pt from occupational demands as well as lesiure activities. Pt to benefit from PT services 2x/wk for 10 weeks with conservative TX approach pending MRI.     Rehab potential: GOOD      Short term goals (5 weeks):   1. Patient to decrease pain to <2/10 at worst with    2. Pt to improve AROM R shoulder flexion to 170 degrees    3. Patient to improve AROM of R shoulder abduction to 165 degrees   4. Patient to improve R grip strength equivalent to L grip strength to aid in ADL's   5. Patient to ambulate without guarding of R shoulder in ADD/IR with normal arm swing noted.    Long term goals (10weeks):   1. Patient to improve PSFS to >7/10 indicating functional improvement   2. Patient to improve proximal stability  of shoulder girdle 4/5 to aid in occupational  demands.   3. Patient to return to occupational demands restriction free   4. Patient to improve function IR/ER of R shoulder to allow patient to complete ADL's/IADL's without restriction             PLAN  Patient will attend 2 times per week x 10 weeks. Therapy may include, but is not limited to THERAPEUTIC EXERCISES, MYOFASCIAL/JOINT MOBILIZATION, POSTURE/BODY MECHANICS, ERGONOMIC TRAINING, HOME INSTRUCTIONS, ULTRASOUND, ELECTRICAL STIMULATION, KINESIOTAPE, and NEURO RE-EDUCATIOIN    Plan for next visit Review response to HEP for AAROM in supine. Initiate conservative tx for R shoulder (PROM, AAROM) modalities as indicated for pain control, taping for optimal shoulder positioning.       Evaluation complexity:   Personal factors impacting POC: OCCUPATIONAL ADLS (IE HEAVY LIFTING, REPETITIVE TASKS, LONG HOURS)   Co-morbidities impacting POC: PRE-EXISTING NEUROLOGICAL DEFICITS  Complexity of physical exam: INCLUDING MUSCULOSKELETAL SYSTEM (POSTURE, ROM, STRENGTH, HEIGHT/WEIGHT) and INCLUDING INTEGUMENTARY EXAM (TISSUE PLIABILITY, SCAR TISSUE, SKIN COLOR)   Clinical Presentation: STABLE   Evaluation Complexity: LOW-HISTORY 0, EXAMINATION 1-2, STABLE PRESENTATION        Total Session Time 40, Timed code minutes 10, and Untimed code minutes 30        Intervention minutes: EVALUATION 30 minutes and THERAPEUTIC EXERCISE 10 minutes    Rushie Goltz, PT  04/17/2022, 08:45    Start of Service: _________          Certification:    From:______  Through:_________    I certify the need for these services furnished under this plan of treatment and while under my care.    Referring Provider Signature: _______________     Date : _____________________    Printed Name of Referring Provider: __________________________________________

## 2022-04-21 ENCOUNTER — Other Ambulatory Visit: Payer: Self-pay

## 2022-04-21 ENCOUNTER — Ambulatory Visit (HOSPITAL_COMMUNITY)
Admission: RE | Admit: 2022-04-21 | Discharge: 2022-04-21 | Disposition: A | Discharge status: Still In Hospital | Payer: Worker's Compensation | Source: Ambulatory Visit | Attending: NURSE PRACTITIONER | Admitting: NURSE PRACTITIONER

## 2022-04-21 NOTE — PT Treatment (Signed)
[facility]  Outpatient Physical Therapy  [place], [address]  [phone]  [phone]    Physical Therapy Treatment Note    Date: 04/21/2022  Patient's Name: Robyn Kim  Date of Birth: 03/01/1971            Visit #/POC: 2/20  Authorization:   POC Signed?: no  POC Ends: 06/12/22  Next Progress Note Due: 10th visit or 05/15/22      Evaluating Physical Therapist: Jonell Cluck, PT, DPT  PT diagnosis/Reason for Referral: pain R shoulder  Next Scheduled Physician Appointment: ?  Allergies/Contraindications: n/a          Subjective: Patient reports she did perform HEP, but not as much as she should have d/t the exercises being more exhausting than she anticipated. She states it was a work out for LUE (past injury) and she is reporting over the past few weeks has started to have soft tissue tightness with pain associated in R UT and supraspinatus.     Objective:  treatment delivered as noted below.     Measured ROM: did not assess, refer to eval.   EXERCISE/ACTIVITY NAME REPETITIONS RESISTANCE COMPLETED THIS DOS   MFR: R supraspinatus and UT    manual y   PROM: all directions     y   R [place] joint mobs: lateral distraction, PA, inferior   Grade 2  y   Kinesio taping: UT and supraspinatus  --GH joint mechanical correction   Inhibit  correction I strip x1 ea  I strip x2 Y  y   Reviewed HEP      y   IFC with MH: R GH joint   x10  y                             Assessment: Patient reports tingling initially in the thumb upon arrival that worsens with Reserve joint mobs and Passive stretching as the numbness has extended to all digits except 5th.  Pain greatest with rotation movements of L shoulder with ER>IR. Correction made to supine AA wand abd and ER.     Plan:  Assess MRI findings. Assess response to treatment this day.     Total Session Time 52, Timed code minutes 35, and Untimed code minutes 10  THERAPEUTIC EXERCISE 20 minutes and JOINT MOBILIZATION/MFR 15  minutes      Benjie Karvonen, PTA  04/21/2022, 09:11

## 2022-04-26 ENCOUNTER — Ambulatory Visit (HOSPITAL_COMMUNITY)
Admission: RE | Admit: 2022-04-26 | Discharge: 2022-04-26 | Disposition: A | Discharge status: Still In Hospital | Payer: Worker's Compensation | Source: Ambulatory Visit | Attending: NURSE PRACTITIONER | Admitting: NURSE PRACTITIONER

## 2022-04-26 ENCOUNTER — Other Ambulatory Visit: Payer: Self-pay

## 2022-04-26 NOTE — PT Treatment (Signed)
[facility]  Outpatient Physical Therapy  [place], [address]  [phone]  [phone]    Physical Therapy Treatment Note    Date: 04/26/2022  Patient's Name: Robyn Kim  Date of Birth: [birth year]            Visit #/POC: 3/20  Authorization:   POC Signed?: no  POC Ends: 06/12/22  Next Progress Note Due: 10th visit or 05/15/22      Evaluating Physical Therapist: Jonell Cluck, PT, DPT  PT diagnosis/Reason for Referral: pain R shoulder  Next Scheduled Physician Appointment: 05/03/22 MRI; 05/08/22 MD  Allergies/Contraindications: n/a          Subjective: Patient reports MRI was rescheduled to 05/03/22 d/t power outage on facility side.  States she felt the kinesio tape caused swelling under arm, but did wear it x2 days. Patient reports no change in RUE from last visit. Pain rating while sitting 4/10, with use of RUE 7/10. Sleep greatly disrupted. She reports tingling in digits of R hand has resolved, decreased numbness in R index finger and unchanged in thumb. She reports also some decreased tightness in R UT.     Objective:  treatment delivered as noted below.     Patient-Specific Functional Score:     Problem Score = 0 (04/17/22)   1. Fishing 0   2. ADL's (doing hair, washing hair) 0   3. Return to full work duty 0   TOTAL 0     Shoulder AROM    LEFT Right AROM RIGHT PROM (Supine)   Flexion 160 82 107 (empty end feel)   Extension 55 70 70 (empty end feel)   Abduction 155       ER C7 Unable  4 (empty end feel)   IR T7 L5      Measured ROM: did not assess, refer to eval.   EXERCISE/ACTIVITY NAME REPETITIONS RESISTANCE COMPLETED THIS DOS   MFR: R supraspinatus and UT    manual n   PROM: all directions     y   R [place] joint mobs: lateral distraction, PA, inferior   Grade 2  y   Kinesio taping: UT and supraspinatus  --GH joint mechanical correction   Inhibit  correction I strip x1 ea  I strip x2 n  n   Reviewed HEP      n   IFC with MH: R GH joint  IFC with CP X10  min  X10 min  N  y   PB rolls on mat table:  --flexion/extension  --1/2 moon rolls      X10  x10    Y  y                     Assessment: Patient is very guarded having empty end feel with passive stretching, she does relax some which allowed me to work into more range. Initiated standing PB rolls as noted above, she again is very guarded and R shoulder does not like coming into extension.     Short term goals (5 weeks):               1. Patient to decrease pain to <2/10 at worst with                2. Pt to improve AROM R shoulder flexion to 170 degrees                3.  Patient to improve AROM of R shoulder abduction to 165 degrees               4. Patient to improve R grip strength equivalent to L grip strength to aid in ADL's               5. Patient to ambulate without guarding of R shoulder in ADD/IR with normal arm swing noted.     Long term goals (10weeks):               1. Patient to improve PSFS to >7/10 indicating functional improvement               2. Patient to improve proximal stability of shoulder girdle 4/5 to aid in occupational demands.               3. Patient to return to occupational demands restriction free               4. Patient to improve function IR/ER of R shoulder to allow patient to complete ADL's/IADL's without restriction      Plan:  Assess patient's response to treatment and CP application.     Total Session Time 53, Timed code minutes 35, and Untimed code minutes 10  Therex x35 min    Benjie Karvonen, PTA  04/26/2022 16:00

## 2022-04-28 ENCOUNTER — Ambulatory Visit (HOSPITAL_COMMUNITY)
Admission: RE | Admit: 2022-04-28 | Discharge: 2022-04-28 | Disposition: A | Discharge status: Still In Hospital | Payer: Worker's Compensation | Source: Ambulatory Visit | Attending: NURSE PRACTITIONER | Admitting: NURSE PRACTITIONER

## 2022-04-28 ENCOUNTER — Other Ambulatory Visit: Payer: Self-pay

## 2022-04-28 NOTE — PT Treatment (Signed)
[facility]  Outpatient Physical Therapy  [place], [address]  [phone]  [phone]    Physical Therapy Treatment Note    Date: 04/28/2022  Patient's Name: Robyn Kim  Date of Birth: [birth year]            Visit #/POC: 4/20  Authorization:   POC Signed?: no  POC Ends: 06/12/22  Next Progress Note Due: 10th visit or 05/15/22      Evaluating Physical Therapist: Jonell Cluck, PT, DPT  PT diagnosis/Reason for Referral: pain R shoulder  Next Scheduled Physician Appointment: 05/03/22 MRI; 05/08/22 MD  Allergies/Contraindications: n/a          Subjective: Patient reports RUE had good response to last visit, but notes the exercises are aggravating LUE that was already painful prior to [facility] secondary to neck issues. She states current LUE symptoms are the same as prior to [facility], but with more intensity. She asked if exercises could be performed with RUE only. She states performance of CP did not change status of pain/symptoms.     Objective:  treatment delivered as noted below    Patient-Specific Functional Score:     Problem Score = 0 (04/28/22)          1. Fishing 0   2. ADL's (doing hair, washing hair) 2   3. Return to full work duty 0   TOTAL .66     Shoulder AROM    LEFT Right AROM RIGHT PROM (Supine)   Flexion 160 82 130 (pain and tightness)   Extension 55 70 70 (empty end feel)   Abduction 155    full (with pain)   ER C7 Unable  35 (pain and tightness)   IR T7 L5 45 (pain and tightness)     Measured ROM:re-assessed PROM today  EXERCISE/ACTIVITY NAME REPETITIONS RESISTANCE COMPLETED THIS DOS   MFR: R supraspinatus and UT    manual n   PROM: all directions     y   R [place] joint mobs: lateral distraction, PA, inferior   Grade 2  y   Kinesio taping: UT and supraspinatus  --GH joint mechanical correction   Inhibit  correction I strip x1 ea  I strip x2 n  n   Reviewed HEP      y   IFC with MH: R GH joint  IFC with CP X10 min  X10 min  y  n   PB rolls on mat  table:  --flexion/extension  --1/2 moon rolls      X10  x10    n  n   Passive scap PNF   y   Supine RUE flexion @ 90 degrees with perturbation   y   Supine ceiling punch 2x10  Y: HEP 1/19   Sidelying: flexion, abd, ER X10, x10, x7  Y: HEP 1/19         Access Code: [email or web address]  URL: [email or web address]  Date: 04/28/2022  Prepared by: Benjie Karvonen    Exercises  - Supine Scapular Protraction in Flexion with Dumbbells  - 2 x daily - 7 x weekly - 1 sets - 10 reps  - Sidelying Shoulder Flexion 15 Degrees  - 2 x daily - 7 x weekly - 1 sets - 10 reps  - Sidelying Shoulder Abduction Palm Forward  - 2 x daily - 7 x weekly - 1 sets - 10 reps  - Sidelying Shoulder External Rotation  - 2 x daily - 7  x weekly - 1 sets - 10 reps      Assessment: Tightness and pain both at end ranges, but she presents with increased PROM vs eval. She reports fatigue with scapular stability exercise. She has pain anytime the shoulder falls into slight extension. Progressed strengthening exercises and they were added to HEP.     Short term goals (5 weeks):               1. Patient to decrease pain to <2/10 at worst with                2. Pt to improve AROM R shoulder flexion to 170 degrees                3. Patient to improve AROM of R shoulder abduction to 165 degrees               4. Patient to improve R grip strength equivalent to L grip strength to aid in ADL's               5. Patient to ambulate without guarding of R shoulder in ADD/IR with normal arm swing noted.     Long term goals (10weeks):               1. Patient to improve PSFS to >7/10 indicating functional improvement               2. Patient to improve proximal stability of shoulder girdle 4/5 to aid in occupational demands.               3. Patient to return to occupational demands restriction free               4. Patient to improve function IR/ER of R shoulder to allow patient to complete ADL's/IADL's without restriction      Plan:  Will work with posterior capsule tightness  next visit. Assess response to progressive treatment and HEP.     Total Session Time 55, Timed code minutes 45, and Untimed code minutes 10  Therex x45 min, IFC x10 min    Benjie Karvonen, PTA  04/28/2022 10:45

## 2022-05-03 ENCOUNTER — Other Ambulatory Visit: Payer: Self-pay

## 2022-05-03 ENCOUNTER — Ambulatory Visit (HOSPITAL_COMMUNITY)
Admission: RE | Admit: 2022-05-03 | Discharge: 2022-05-03 | Disposition: A | Discharge status: Still In Hospital | Payer: Worker's Compensation | Source: Ambulatory Visit | Attending: NURSE PRACTITIONER | Admitting: NURSE PRACTITIONER

## 2022-05-03 NOTE — PT Treatment (Addendum)
[facility]  Outpatient Physical Therapy  [place], [address]  [phone]  [phone]    Physical Therapy Treatment Note    Date: 05/03/2022  Patient's Name: Robyn Kim  Date of Birth: 04/19/1970            Visit #/POC: 5/20  Authorization:   POC Signed?: no  POC Ends: 06/12/22  Next Progress Note Due: 10th visit or 05/15/22      Evaluating Physical Therapist: Jonell Cluck, PT, DPT  PT diagnosis/Reason for Referral: pain R shoulder  Next Scheduled Physician Appointment: 05/08/22 MD  Allergies/Contraindications: n/a          Subjective: Patient reports she responded to last visit with less pain than visit prior. She states she was able to use RUE "some" to wash her hair. Overall, progress since [facility] concerning function ~8-10%, concerning pain 0%. Pain is reported to be 6/10 upon arrival and described as burning pain in R triceps to elbow, after UBE performance without resistance pain is reported to be in the shoulder 6/10 with no increased pain in triceps. HEP given last visit is reported to be better than using cane, but notes fatigue. She reports she has used the computer mouse more today with RUE and is noting increased burning in triceps and tingling in digits. She states radicular pain travels to elbow and then does not seem to be present in FA.  Patient has MRI of R shoulder at 4pm this day.     Objective:  treatment delivered as noted below    Patient-Specific Functional Score:     Problem Score = 0 (04/28/22)          1. Fishing 0   2. ADL's (doing hair, washing hair) 2   3. Return to full work duty 0   TOTAL .66     Shoulder AROM    LEFT Right AROM RIGHT PROM (Supine)   Flexion 160 82 130 (pain and tightness)   Extension 55 70 70 (empty end feel)   Abduction 155    full (with pain)   ER C7 Unable  35 (pain and tightness)   IR T7 L5 45 (pain and tightness)     Measured ROM: During AA flexion wall slides with towel 2 hands 95 degrees.    EXERCISE/ACTIVITY NAME REPETITIONS RESISTANCE COMPLETED THIS DOS   MFR: R supraspinatus and UT    manual n   PROM: all directions     y   R [place] joint mobs: lateral distraction, PA, inferior    Inferior/lateral glides with RUE in abd, ER with elbow flexion    ER with anterior mob   Grade 2  N      Y    y       Kinesio taping: UT and supraspinatus  --GH joint mechanical correction   Inhibit  correction I strip x1 ea  I strip x2 n  n   Reviewed HEP      y   IFC with MH: R GH joint  IFC with CP X10 min  X10 min  y  n   PB rolls on mat table:  --flexion/extension  --1/2 moon rolls      X10  x10    n  n   Passive scap PNF   n   Supine RUE flexion @ 90 degrees with perturbation   n   Supine ceiling punch 2x10  n: HEP 1/19  Sidelying: flexion, abd, ER X10, x10, x7  N:  HEP 1/19   UBE  X5 min L0 y   Pulleys: flexion and scaption X15 ea  Y: HEP 05/03/22   Flexion towel slides at door x5 AA with 2 hands y     Access Code: [email or web address]  URL: [email or web address]  Date: 05/03/2022  Prepared by: Benjie Karvonen    Exercises  - Seated Shoulder Flexion AAROM with Pulley Behind  - 2 x daily - 7 x weekly - 1 sets - 20 reps  - Seated Shoulder Scaption AAROM with Pulley at Side  - 2 x daily - 7 x weekly - 1 sets - 20 reps    Assessment: Worked with decreasing joint tightness of R shoulder specifically the posterior capsule. Initiated pulleys for flexion and abd and added to current HEP. Post treatment she does report pain has decreased some, but does not give rating. She struggles with AA 2 handed wall slides into flexion with towel secondary to pain. UBE was also initiated this day to facilitate ROM and strengthening of RUE. Patient tolerates all aspects of therapy, but grimace noted d/t pain generated with treatment.     Short term goals (5 weeks):               1. Patient to decrease pain to <2/10 at worst with                2. Pt to improve AROM R shoulder flexion to 170 degrees                3. Patient to improve AROM of R  shoulder abduction to 165 degrees               4. Patient to improve R grip strength equivalent to L grip strength to aid in ADL's               5. Patient to ambulate without guarding of R shoulder in ADD/IR with normal arm swing noted.     Long term goals (10weeks):               1. Patient to improve PSFS to >7/10 indicating functional improvement               2. Patient to improve proximal stability of shoulder girdle 4/5 to aid in occupational demands.               3. Patient to return to occupational demands restriction free               4. Patient to improve function IR/ER of R shoulder to allow patient to complete ADL's/IADL's without restriction      Plan:  Assess if she had obtained findings from MRI of R shoulder scheduled for later this day.     Total Session Time 45, Timed code minutes 35, and Untimed code minutes 10  Therex x35 min, IFC x10 min    Benjie Karvonen, PTA  05/03/2022 13:40

## 2022-05-05 ENCOUNTER — Other Ambulatory Visit: Payer: Self-pay

## 2022-05-05 ENCOUNTER — Ambulatory Visit (HOSPITAL_COMMUNITY)
Admission: RE | Admit: 2022-05-05 | Discharge: 2022-05-05 | Disposition: A | Discharge status: Still In Hospital | Payer: Worker's Compensation | Source: Ambulatory Visit | Attending: NURSE PRACTITIONER | Admitting: NURSE PRACTITIONER

## 2022-05-05 NOTE — PT Treatment (Signed)
[facility]  Outpatient Physical Therapy  [place], [address]  [phone]  [phone]    Physical Therapy Treatment Note    Date: 05/05/2022  Patient's Name: Robyn Kim  Date of Birth: 01-11-1971            Visit #/POC: 6/20  Authorization:   POC Signed?: no  POC Ends: 06/12/22  Next Progress Note Due: 10th visit or 05/15/22      Evaluating Physical Therapist: Jonell Cluck, PT, DPT  PT diagnosis/Reason for Referral: pain R shoulder  Next Scheduled Physician Appointment: 05/08/22 MD  Allergies/Contraindications: n/a          Subjective: Patient is having difficulty obtaining MRI results of R shoulder. Has not worked with home pulley's yet as she forgot them in the car and her son took her car out of town for a few days.     Objective:  treatment delivered as noted below    Patient-Specific Functional Score:     Problem Score = 0 (04/28/22)          1. Fishing 0   2. ADL's (doing hair, washing hair) 2   3. Return to full work duty 0   TOTAL .66     Shoulder AROM    LEFT Right AROM RIGHT PROM (Supine)   Flexion 160 82 130 (pain and tightness)   Extension 55 70 70 (empty end feel)   Abduction 155    full (with pain)   ER C7 Unable  35 (pain and tightness)   IR T7 L5 45 (pain and tightness)     Measured ROM: not assessed this day  EXERCISE/ACTIVITY NAME REPETITIONS RESISTANCE COMPLETED THIS DOS   MFR: R supraspinatus and UT    manual n   PROM: all directions     y   R [place] joint mobs: lateral distraction, PA, inferior    Inferior/lateral glides with RUE in abd, ER with elbow flexion    ER with anterior mob   Grade 2  N      n    n       Kinesio taping: UT and supraspinatus  --GH joint mechanical correction   Inhibit  correction I strip x1 ea  I strip x2 n  n   Reviewed HEP      n   IFC with MH: R GH joint  IFC with CP X10 min  X10 min  y  n   PB rolls on mat table:  --flexion/extension  --1/2 moon rolls      X10  x10    n  n   Passive scap PNF   n   Supine  RUE flexion @ 90 degrees with perturbation   n   Supine ceiling punch 2x10  n: HEP 1/19   Sidelying: flexion, abd, ER X10, x10, x7  N:  HEP 1/19   UBE  X5 min L0 y   Pulleys: flexion and scaption X15 ea  Y: HEP 05/03/22   Flexion towel slides at door x5 AA with 2 hands n   IR towel stretch X5 with brief hold  Y: HEP 05/05/22       Assessment: patient was shown modified IR towel stretch and instructed to perform at home. She performed pulley's prior to passive stretching and was less guarded.       Short term goals (5 weeks):  1. Patient to decrease pain to <2/10 at worst with                2. Pt to improve AROM R shoulder flexion to 170 degrees                3. Patient to improve AROM of R shoulder abduction to 165 degrees               4. Patient to improve R grip strength equivalent to L grip strength to aid in ADL's               5. Patient to ambulate without guarding of R shoulder in ADD/IR with normal arm swing noted.     Long term goals (10weeks):               1. Patient to improve PSFS to >7/10 indicating functional improvement               2. Patient to improve proximal stability of shoulder girdle 4/5 to aid in occupational demands.               3. Patient to return to occupational demands restriction free               4. Patient to improve function IR/ER of R shoulder to allow patient to complete ADL's/IADL's without restriction    Access Code: [email or web address]  URL: [email or web address]  Date: 05/05/2022  Prepared by: Benjie Karvonen    Exercises  - Standing Shoulder Internal Rotation Stretch with Towel  - 2 x daily - 7 x weekly - 10 reps - 10 sec hold    Plan:  Assess status of R shoulder, MRI findings and tolerance to IR towel stretch.     Total Session Time 45, Timed code minutes 40, and Untimed code minutes 10  Therex x30 min, IFC x10 min    Benjie Karvonen, PTA  05/05/2022 10:46

## 2022-05-08 ENCOUNTER — Other Ambulatory Visit (HOSPITAL_COMMUNITY): Payer: Self-pay | Admitting: NURSE PRACTITIONER

## 2022-05-08 ENCOUNTER — Ambulatory Visit (HOSPITAL_COMMUNITY)
Admission: RE | Admit: 2022-05-08 | Discharge: 2022-05-08 | Disposition: A | Discharge status: Still In Hospital | Payer: Worker's Compensation | Source: Ambulatory Visit | Attending: NURSE PRACTITIONER | Admitting: NURSE PRACTITIONER

## 2022-05-08 DIAGNOSIS — M25511 Pain in right shoulder: Secondary | ICD-10-CM

## 2022-05-08 NOTE — PT Treatment (Signed)
[facility]  Outpatient Physical Therapy  [place], [address]  [phone]  [phone]    Physical Therapy Progress Note    Date: 05/08/2022  Patient's Name: Robyn Kim  Date of Birth: [birth year]      Visit #/POC: 7/20  Authorization:   POC Signed?: no  POC Ends: 06/12/22  Next Progress Note Due: 10th visit or 05/15/22        Evaluating Physical Therapist: Jonell Cluck, PT, DPT  PT diagnosis/Reason for Referral: pain R shoulder (R supraspinatus tendonitis, subacromial impingement)  Next Scheduled Physician Appointment: 06/19/22  Allergies/Contraindications: n        Subjective: Patient had a follow up with PCP last week to review her MRI. They recommended she continue with PT services focusing on conservative treatment in order to rehab R shoulder. Patient notes some mild improvements in specific ROM however still having a lot of pain on average 5-7/10. Patient feels as if her IR behind her back has worsened as she is having difficulty getting her bra on and off.        IMAGING: MRI 04/21/22: Mild supraspinatus tenonditis, mild degenerative changes of the [facility] joint which are minimally impinging on the subacromial space. Findings are chronic     Objective:  treatment delivered as noted below     Patient-Specific Functional Score:     Problem (04/28/22)        05/08/22   1. Fishing 0 0   2. ADL's (doing hair, washing hair) 2 3.5   3. Return to full work duty 0 0   TOTAL .66 1.16      Shoulder AROM   RIGHT SHOULDER AROM   04/17/22 (sit) PROM 04/17/22 (Supine) PROM (supine) 05/08/22   Flexion 82 130 (pain and tightness) 115 127 (empty end feel)   Abduction 70 degrees 70 degrees  70 degrees (empty end feel)   ER Unable  35 (pain and tightness) N/T   IR L5 45 (pain and tightness) Piriformis         Grip strength    04/17/22 05/08/22   90* elbow bend 8 #  7#   75* shoulder flexion straight elbow 5# N/T       Measured ROM: not assessed this day  EXERCISE/ACTIVITY NAME  REPETITIONS RESISTANCE COMPLETED THIS DOS   PROM: all directions        Yes   R GH joint mobs: lateral distraction, PA, inferior     Inferior/lateral glides with RUE in abd, ER with elbow flexion     ER with anterior mob    Grade 2   Yes (tender with inferior glide)        n     n         Kinesio taping: UT and supraspinatus  --GH joint mechanical correction    Inhibit  correction I strip x1 ea  I strip x2 n  n   Reviewed HEP         Yes   IFC with MH: R GH joint  IFC with CP X10 min  X10 min   N  n   PB rolls on mat table:  --flexion/extension  --1/2 moon rolls       X10  x10      n  n   Passive scap PNF     n   Supine RUE flexion @ 90 degrees with perturbation     n  Supine ceiling punch 2x10   n:     Sidelying: flexion, abd, ER X10, x10, x7   N:     UBE  X5 min L0 y   Pulleys: flexion and scaption X15 ea   Y: HEP 05/03/22   Flexion towel slides at door x5 AA with 2 hands n   IR towel stretch X5 with brief hold   No   Supine Shoulder Flexion Overhead with Dowel   Y + HEP 05/08/22   Supine Shoulder Abduction AAROM with Dowel   Y + HEP 05/08/22   Supine Shoulder Horizontal Abduction Adduction AAROM with Dowel   Y + HEP 05/08/22    Supine Shoulder External Rotation AAROM with Dowel (45 * scaption)   Y + HEP 05/08/22   Shoulder Pendulum with Table Support   Flexion-Extension   Horizontal   Circular   Y + HEP 05/08/22   Isometric Shoulder Flexion at Wall   Y + HEP 05/08/22    Isometric Shoulder Extension at Wall    Y + HEP 05/08/22    Access Code: [facility]  URL: [email or web address]  Date: 05/08/2022  Prepared by: Jonell Cluck    Exercises  - Supine Shoulder Flexion Overhead with Dowel  - 1-2 x daily - 5 x weekly - 10-15 reps  - Supine Shoulder Abduction AAROM with Dowel  - 1-2 x daily - 5 x weekly - 10-15 reps  - Supine Shoulder Horizontal Abduction Adduction AAROM with Dowel  - 1-2 x daily - 5 x weekly - 10-15 reps  - Supine Shoulder External Rotation AAROM with Dowel  - 1-2 x daily - 5 x weekly - 10-15 reps  -  Seated Shoulder Flexion AAROM with Pulley Behind  - 1-2 x daily - 5 x weekly - 15-20 reps  - Flexion-Extension Shoulder Pendulum with Table Support  - 1 x daily - 7 x weekly - 3 sets - 10 reps  - Horizontal Shoulder Pendulum with Table Support  - 1 x daily - 7 x weekly - 3 sets - 10 reps  - Circular Shoulder Pendulum with Table Support  - 1 x daily - 7 x weekly - 3 sets - 10 reps  - Isometric Shoulder Flexion at Wall  - 1-2 x daily - 5 x weekly - 10-15 reps - 5 sec hold  - Isometric Shoulder Extension at Wall  - 1-2 x daily - 5 x weekly - 10-15 reps - 5 sec hold  VERBAL HEP  - Grip strengthening with yellow putty vs stress ball - intermittently throughout the day (verbal)  - Elbow AROM (supination/pronation, flexion/extension) - intermittently throughout the day (verbal)     Assessment:  Patient has attended 7 PT sessions since [facility] to address R shoulder pain. 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 Patients progress since [facility] has been gradual. PT re-assessment notes no improvement in R grip strength, no change in R shoulder PROM, similar pain levels and some improvement on PSFS. Upon further review, pt was having difficulty with completing HEP at home due to not having her pulleys and having difficulty with AAROM for R shoulder due to limitations in L shoulder. HEP was modified to ensure no limitation in completion, pt has a very low fatigue level so reviewed breaking up completion of these in order to avoid excerebration of symptoms. PT plan of care is shifting to conservative treatment to address recent MRI findings. PT  updated HEP and reviewed with patient importance on maintaining and improving current ROM and managing pain prior to progressing to strengthening. Pt has a lot of anterior chest tightness which impacts positioning of R shoulder girdle and may be contributing to impingement symptoms. PT POC will  focus on conservative treatment for subacromial impingement and Supraspinatus tendonitis focusing on flexibility to decrease UE muscular imbalances to improve positioning, improving distal strength, improving R UE P/AROM, modalities and manual for pain control and further conservative measures. If no improvement is noted within the next 3-4 weeks pt will be referred back to PCP for further evaluation.     Short term goals (5 weeks):               1. Patient to decrease pain to <2/10 at worst with (NOT MET 05/08/22)               2. Pt to improve AROM R shoulder flexion to 170 degrees  (NOT MET 05/08/22)               3. Patient to improve AROM of R shoulder abduction to 165 degrees (NOT MET 05/08/22)               4. Patient to improve R grip strength equivalent to L grip strength to aid in ADL's (NOT MET 05/08/22)               5. Patient to ambulate without guarding of R shoulder in ADD/IR with normal arm swing noted. (NOT MET 05/08/22)     Long term goals (10weeks):               1. Patient to improve PSFS to >7/10 indicating functional improvement ( PROGRESSING 05/08/22)               2. Patient to improve proximal stability of shoulder girdle 4/5 to aid in occupational demands. (NOT MET 05/08/22)               3. Patient to return to occupational demands restriction free (NOT MET 05/08/22)               4. Patient to improve function IR/ER of R shoulder to allow patient to complete ADL's/IADL's without restriction   (NOT MET 05/08/22)    Plan:  Conservative treatment for subacromial impingement and supraspinatus tendonitis. PROM, modalities for inflammation/pain control ([place] + E-stim combo), flexibility exercises if tolerated below shoulder level. Assess response to updated HEP last session and introduce isometrics in remaining planes if tolerated well.      Total Session Time 43 and Timed code minutes 39  THERAPEUTIC EXERCISE 39 minutes      Rushie Goltz, PT  05/08/2022, 14:15

## 2022-05-12 ENCOUNTER — Other Ambulatory Visit: Payer: Self-pay

## 2022-05-12 ENCOUNTER — Ambulatory Visit (HOSPITAL_COMMUNITY)
Admission: RE | Admit: 2022-05-12 | Discharge: 2022-05-12 | Disposition: A | Discharge status: Still In Hospital | Payer: Worker's Compensation | Source: Ambulatory Visit | Attending: NURSE PRACTITIONER | Admitting: NURSE PRACTITIONER

## 2022-05-12 NOTE — PT Treatment (Signed)
[facility]  Outpatient Physical Therapy  [place], [address]  [phone]  [phone]    Physical Therapy Progress Note    Date: 05/12/2022  Patient's Name: Robyn Kim  Date of Birth: [birth year]      Visit #/POC: 8/20  Authorization: 8/12 visits;  POC Signed?: no  POC Ends: 06/12/22  Next Progress Note Due: 10th visit or 05/15/22        Evaluating Physical Therapist: Jonell Cluck, PT, DPT  PT diagnosis/Reason for Referral: pain R shoulder (R supraspinatus tendonitis, subacromial impingement)  Next Scheduled Physician Appointment: 06/19/22  Allergies/Contraindications: n        Subjective: Patient had a follow up with PCP last week to review her MRI. They recommended she continue with PT services focusing on conservative treatment in order to rehab R shoulder. Patient notes some mild improvements in specific ROM however still having a lot of pain on average 5-7/10. Patient feels as if her IR behind her back has worsened as she is having difficulty getting her bra on and off.        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 Findings are chronic     Objective:  treatment delivered as noted below     Patient-Specific Functional Score:     Problem (04/28/22)        05/08/22   1. Fishing 0 0   2. ADL's (doing hair, washing hair) 2 3.5   3. Return to full work duty 0 0   TOTAL .66 1.16      Shoulder AROM   RIGHT SHOULDER AROM   04/17/22 (sit) PROM 04/17/22 (Supine) PROM (supine) 05/08/22   Flexion 82 130 (pain and tightness) 115 127 (empty end feel)   Abduction 70 degrees 70 degrees  70 degrees (empty end feel)   ER Unable  35 (pain and tightness) N/T   IR L5 45 (pain and tightness) Piriformis         Grip strength    04/17/22 05/08/22   90* elbow bend 8 #  7#   75* shoulder flexion straight elbow 5# N/T       Measured ROM: not assessed this  day  EXERCISE/ACTIVITY NAME REPETITIONS RESISTANCE COMPLETED THIS DOS   PROM: all directions        n   R GH joint mobs: lateral distraction, PA, inferior     Inferior/lateral glides with RUE in abd, ER with elbow flexion     ER with anterior mob    Grade 2   Yes (tender with inferior glide)        n     n         Kinesio taping: UT and supraspinatus  --GH joint mechanical correction    Inhibit  correction I strip x1 ea  I strip x2 n  n   Reviewed HEP         n   IFC with MH: R GH joint  IFC with CP X10 min  X10 min   N  n   PB rolls on mat table:  --flexion/extension  --1/2 moon rolls       X10  x10      n  n   Passive scap PNF     n   Supine RUE flexion @ 90 degrees with perturbation     n  Supine ceiling punch 2x10   n:     Sidelying: flexion, abd, ER X10, x10, x7   N:     UBE  X5 min L0 n   Pulleys: flexion and scaption X15 ea   n: HEP 05/03/22   Flexion towel slides at door x5 AA with 2 hands n   IR towel stretch X5 with brief hold   No   Supine Shoulder Flexion Overhead with Dowel   n + HEP 05/08/22   Supine Shoulder Abduction AAROM with Dowel   n + HEP 05/08/22   Supine Shoulder Horizontal Abduction Adduction AAROM with Dowel   n + HEP 05/08/22    Supine Shoulder External Rotation AAROM with Dowel (45 * scaption)   n+ HEP 05/08/22   Shoulder Pendulum with Table Support   Flexion-Extension   Horizontal   Circular   n+ HEP 05/08/22   Isometric Shoulder Flexion at Wall   n+ HEP 05/08/22    Isometric Shoulder Extension at Wall    n + HEP 05/08/22   Combo: R UT, supra, levator, infra X8 min  y   Kinesio tape: R UT, supra, levator Inhibit I strip x1 ea  y   Hooklying anterior chest stretch:   Static with larger pool noodle X5 min  y       Assessment:   Treatment focused on conservative measures to reduce supraspinatus tendonitis. Kinesio tape applied to inhibit muscles noted above and static anterior chest stretch performed to reduce impingement.   Short term goals (5 weeks):               1. Patient to decrease pain to  <2/10 at worst with (NOT MET 05/08/22)               2. Pt to improve AROM R shoulder flexion to 170 degrees  (NOT MET 05/08/22)               3. Patient to improve AROM of R shoulder abduction to 165 degrees (NOT MET 05/08/22)               4. Patient to improve R grip strength equivalent to L grip strength to aid in ADL's (NOT MET 05/08/22)               5. Patient to ambulate without guarding of R shoulder in ADD/IR with normal arm swing noted. (NOT MET 05/08/22)     Long term goals (10weeks):               1. Patient to improve PSFS to >7/10 indicating functional improvement ( PROGRESSING 05/08/22)               2. Patient to improve proximal stability of shoulder girdle 4/5 to aid in occupational demands. (NOT MET 05/08/22)               3. Patient to return to occupational demands restriction free (NOT MET 05/08/22)               4. Patient to improve function IR/ER of R shoulder to allow patient to complete ADL's/IADL's without restriction   (NOT MET 05/08/22)    Plan:  Assess response to treatment this day and treat according. Will initiate remaining planes for shoulder isometrics.     Total Session Time 41 and Timed code minutes 28  THERAPEUTIC EXERCISE 20 minutes, combo x8 min    Benjie Karvonen, [place]  05/12/2022 16:42

## 2022-05-15 ENCOUNTER — Ambulatory Visit (HOSPITAL_COMMUNITY): Payer: Self-pay

## 2022-05-16 ENCOUNTER — Ambulatory Visit
Admission: RE | Admit: 2022-05-16 | Discharge: 2022-05-16 | Disposition: A | Discharge status: Still In Hospital | Payer: Worker's Compensation | Source: Ambulatory Visit | Attending: NURSE PRACTITIONER | Admitting: NURSE PRACTITIONER

## 2022-05-16 ENCOUNTER — Other Ambulatory Visit: Payer: Self-pay

## 2022-05-16 NOTE — PT Treatment (Addendum)
[facility]  Outpatient Physical Therapy  [place], [address]  [phone]  [phone]    Physical Therapy Progress Note    Date: 05/16/2022  Patient's Name: Robyn Kim  Date of Birth: 1970-11-09      Visit #/POC: 9/20  Authorization: 9/12 visits;  POC Signed?: no  POC Ends: 06/12/22  Next Progress Note Due: 10th visit or 05/15/22        Evaluating Physical Therapist: Jonell Cluck, PT, DPT  PT diagnosis/Reason for Referral: pain R shoulder (R supraspinatus tendonitis, subacromial impingement)  Next Scheduled Physician Appointment: 06/19/22  Allergies/Contraindications: n        Subjective: Patient reports the last two days have been awful and that she was almost in tears while using computer mouse. She further states even the jarring while riding in the car and hitting a bump was painful. Over the past two days pain 7/10 in R UT/supraspinatus region throughout entire RUE, currently 6/10 with tightness in UT/supraspinatus with aching in FA and hand with digits thumb and digits 2-3 numb. She feels she is 20% improved since [facility] [place] with being able to use RUE some to assist in washing hair. She states unsure if kinesio tape was helpful. 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 Findings are chronic     Objective:  treatment delivered as noted below     Patient-Specific Functional Score:     Problem (04/28/22)        05/08/22   1. Fishing 0 0   2. ADL's (doing hair, washing hair) 2 3.5   3. Return to full work duty 0 0   TOTAL .66 1.16      Shoulder AROM   RIGHT SHOULDER AROM   04/17/22 (sit) PROM 04/17/22 (Supine) PROM (supine) 05/08/22   Flexion 82 130 (pain and tightness) 115 127 (empty end feel)   Abduction 70 degrees 70 degrees  70 degrees (empty end feel)   ER Unable  35 (pain and tightness) N/T   IR L5 45 (pain and tightness)  Piriformis         Grip strength    04/17/22 05/08/22   90* elbow bend 8 #  7#   75* shoulder flexion straight elbow 5# N/T       Measured ROM: not assessed this day  EXERCISE/ACTIVITY NAME REPETITIONS RESISTANCE COMPLETED THIS DOS   PROM: all directions        n   R GH joint mobs: lateral distraction, PA, inferior     Inferior/lateral glides with RUE in abd, ER with elbow flexion     ER with anterior mob    Grade 2   Yes (tender with inferior glide)        n     n         Kinesio taping: UT and supraspinatus  --GH joint mechanical correction    Inhibit  correction I strip x1 ea  I strip x2 n  n   Reviewed HEP         n   IFC with MH: R GH joint  IFC with CP X10 min  X10 min   N  n   PB rolls on mat table:  --flexion/extension  --1/2 moon rolls       X10  x10      n  n   Passive scap PNF     n   Supine RUE flexion @ 90 degrees with perturbation     n   Supine ceiling punch 2x10   n:     Sidelying: flexion, abd, ER X10, x10, x7   N:     UBE  X5 min L0 n   Pulleys: flexion and scaption X15 ea   n: HEP 05/03/22   Flexion towel slides at door x5 AA with 2 hands n   IR towel stretch X5 with brief hold   No   Supine Shoulder Flexion Overhead with Dowel   n + HEP 05/08/22   Supine Shoulder Abduction AAROM with Dowel   n + HEP 05/08/22   Supine Shoulder Horizontal Abduction Adduction AAROM with Dowel   n + HEP 05/08/22    Supine Shoulder External Rotation AAROM with Dowel (45 * scaption)   n+ HEP 05/08/22   Shoulder Pendulum with Table Support   Flexion-Extension   Horizontal   Circular   n+ HEP 05/08/22   Isometric Shoulder Flexion at Wall   n+ HEP 05/08/22    Isometric Shoulder Extension at Wall    n + HEP 05/08/22   Combo: R UT, supra, levator, infra  [place]: same as combo X8 min  X8 min   Cont, 1.5 W/cm2 N  y   Kinesio tape: R UT, supra, levator Inhibit I strip x1 ea  n   Hooklying anterior chest stretch:   Static with larger pool noodle X5 min  n   Cervical stretching X3 with 10 sec each active Y: HEP 05/18/22   Median nerve glide x10   Y: HEP 05/18/22     Access Code: [email or web address]  URL: [email or web address]  Date: 05/16/2022  Prepared by: Benjie Karvonen    Exercises  - Seated Cervical Sidebending Stretch  - 1 x daily - 7 x weekly - 1 sets - 3 reps hold 5-10 sec  - Seated Levator Scapulae Stretch  - 1 x daily - 7 x weekly - 1 sets - 3 reps hold 5-10 sec  - Standing Cervical Rotation Stretch  - 1 x daily - 7 x weekly - 1 sets - 3 reps hold 5-10 sec  - Median Nerve Glide  - 4 x daily - 7 x weekly - 1 sets - 10 reps     Assessment:  Patient is having possible median nerve symptoms as when performing this nerve glide her radicular symptoms are recreated. Treatment today focused on muscle hypertonicity reduction to assess if this could be reason for radicular symptoms. Post treatment she reports feeling fingers are less tingling and RUE is some better.     Short term goals (5 weeks):               1. Patient to decrease pain to <2/10 at worst with (NOT MET 05/08/22)               2. Pt to improve AROM R shoulder flexion to 170 degrees  (NOT MET 05/08/22)               3. Patient to improve AROM of R shoulder abduction to 165 degrees (NOT MET 05/08/22)               4. Patient to improve R grip strength equivalent to L grip strength to aid in ADL's (NOT MET 05/08/22)               5.  Patient to ambulate without guarding of R shoulder in ADD/IR with normal arm swing noted. (NOT MET 05/08/22)     Long term goals (10weeks):               1. Patient to improve PSFS to >7/10 indicating functional improvement ( PROGRESSING 05/08/22)               2. Patient to improve proximal stability of shoulder girdle 4/5 to aid in occupational demands. (NOT MET 05/08/22)               3. Patient to return to occupational demands restriction free (NOT MET 05/08/22)               4. Patient to improve function IR/ER of R shoulder to allow patient to complete ADL's/IADL's without restriction   (NOT MET 05/08/22)    Plan:  Assess response to treatment    Total Session Time 30 and  Timed code minutes 30  [place] x8 min, therex x22 min    Benjie Karvonen, PTA  05/16/2022 15:56

## 2022-05-18 ENCOUNTER — Ambulatory Visit (HOSPITAL_COMMUNITY)
Admission: RE | Admit: 2022-05-18 | Discharge: 2022-05-18 | Disposition: A | Discharge status: Still In Hospital | Payer: Worker's Compensation | Source: Ambulatory Visit | Attending: NURSE PRACTITIONER | Admitting: NURSE PRACTITIONER

## 2022-05-18 ENCOUNTER — Other Ambulatory Visit: Payer: Self-pay

## 2022-05-18 DIAGNOSIS — M25511 Pain in right shoulder: Secondary | ICD-10-CM | POA: Insufficient documentation

## 2022-05-18 NOTE — PT Treatment (Signed)
[facility]  Outpatient Physical Therapy  [place], [address]  [phone]  [phone]    Physical Therapy Treatment Note    Date: 05/18/2022  Patient's Name: Robyn Kim  Date of Birth: [birth year]      Visit #/POC: 10/20  Authorization: 10/12 visits;  POC Signed?: no  POC Ends: 06/26/22  Next Progress Note Due: 10th visit or 05/15/22        Evaluating Physical Therapist: Jonell Cluck, PT, DPT  PT diagnosis/Reason for Referral: pain R shoulder (R supraspinatus tendonitis, subacromial impingement)  Next Scheduled Physician Appointment: 06/19/22  Allergies/Contraindications: n        Subjective: Feels she can swipe phone with R digits a little longer since initiation of median nerve glides besides this no real changes since last visit. She reports she ran into door facing this morning with R shoulder d/t LOB and is reporting considerable soreness in R GH joint. She does feel the combo treatment to R UT was more beneficial than just [place], she states boty treatments offered decreased muscle tightness into the following day with combo slightly longer.       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 Findings are chronic      Objective:  treatment delivered as noted below     Patient-Specific Functional Score:     Problem (04/28/22)        05/08/22   1. Fishing 0 0   2. ADL's (doing hair, washing hair) 2 3.5   3. Return to full work duty 0 0   TOTAL .66 1.16      Shoulder AROM   RIGHT SHOULDER AROM   04/17/22 (sit) PROM 04/17/22 (Supine) PROM (supine) 05/08/22   Flexion 82 130 (pain and tightness) 115 127 (empty end feel)   Abduction 70 degrees 70 degrees  70 degrees (empty end feel)   ER Unable  35 (pain and tightness) N/T   IR L5 45 (pain and tightness) Piriformis               Grip strength     04/17/22 05/08/22   90* elbow bend 8 #  7#   75* shoulder flexion  straight elbow 5# N/T         Measured ROM: not assessed this day  EXERCISE/ACTIVITY NAME REPETITIONS RESISTANCE COMPLETED THIS DOS   PROM: all directions        n   R GH joint mobs: lateral distraction, PA, inferior     Inferior/lateral glides with RUE in abd, ER with elbow flexion     ER with anterior mob    Grade 2   N (tender with inferior glide)        n     n         Kinesio taping: UT and supraspinatus  --GH joint mechanical correction    Inhibit  correction I strip x1 ea  I strip x2 n  n   Reviewed HEP         n   IFC with MH: R GH joint  IFC with CP X10 min  X10 min   N  n   PB rolls on mat table:  --flexion/extension  --1/2 moon rolls       X10  x10      n  n   Passive scap PNF  n   Supine RUE flexion @ 90 degrees with perturbation     n   Supine ceiling punch 2x10   n:     Sidelying: flexion, abd, ER X10, x10, x7   N:     UBE  X5 min L0 n   Pulleys: flexion and scaption X15 ea   n: HEP 05/03/22   Flexion towel slides at door x5 AA with 2 hands n   IR towel stretch X5 with brief hold   No   Supine Shoulder Flexion Overhead with Dowel     n + HEP 05/08/22   Supine Shoulder Abduction AAROM with Dowel     n + HEP 05/08/22   Supine Shoulder Horizontal Abduction Adduction AAROM with Dowel     n + HEP 05/08/22    Supine Shoulder External Rotation AAROM with Dowel (45 * scaption)     n+ HEP 05/08/22   Shoulder Pendulum with Table Support   Flexion-Extension   Horizontal   Circular     n+ HEP 05/08/22   Isometric Shoulder Flexion at Wall     n+ HEP 05/08/22    Isometric Shoulder Extension at Wall      n + HEP 05/08/22   Combo: R UT, supra, levator, infra X8 min   y   Kinesio tape: R UT, supra, levator Inhibit I strip x1 ea   n   Hooklying anterior chest stretch:   Static with larger pool noodle X5 min   n   MFR: R UT, supra, levator, infrap   y             Assessment:   Performed combo treatment since that did seem to reduce symptoms longer. She is very tight with "clicking" noted with MFR over supraspinatus and UT.  Post treatment patient reports feeling considerably less tight. She was instructed to continue with cervical stretches and median nerve glides.     Short term goals (5 weeks):               1. Patient to decrease pain to <2/10 at worst with (NOT MET 05/08/22)               2. Pt to improve AROM R shoulder flexion to 170 degrees  (NOT MET 05/08/22)               3. Patient to improve AROM of R shoulder abduction to 165 degrees (NOT MET 05/08/22)               4. Patient to improve R grip strength equivalent to L grip strength to aid in ADL's (NOT MET 05/08/22)               5. Patient to ambulate without guarding of R shoulder in ADD/IR with normal arm swing noted. (NOT MET 05/08/22)     Long term goals (10weeks):               1. Patient to improve PSFS to >7/10 indicating functional improvement ( PROGRESSING 05/08/22)               2. Patient to improve proximal stability of shoulder girdle 4/5 to aid in occupational demands. (NOT MET 05/08/22)               3. Patient to return to occupational demands restriction free (NOT MET 05/08/22)               4. Patient to improve  function IR/ER of R shoulder to allow patient to complete ADL's/IADL's without restriction   (NOT MET 05/08/22)     Plan:  Assess status of shoulder and radicular symptoms.       Total Session Time 30, Timed code minutes 30, and Untimed code minutes 0  JOINT MOBILIZATION/MFR 20 minutes and ULTRASOUND/E-STIM COMBO 10 minutes      Benjie Karvonen, PTA  05/18/2022, 13:41

## 2022-05-22 ENCOUNTER — Other Ambulatory Visit: Payer: Self-pay

## 2022-05-22 ENCOUNTER — Ambulatory Visit (HOSPITAL_COMMUNITY)
Admission: RE | Admit: 2022-05-22 | Discharge: 2022-05-22 | Disposition: A | Discharge status: Still In Hospital | Payer: Worker's Compensation | Source: Ambulatory Visit | Attending: NURSE PRACTITIONER | Admitting: NURSE PRACTITIONER

## 2022-05-22 NOTE — PT Treatment (Signed)
[facility]  Outpatient Physical Therapy  [place], [address]  [phone]  [phone]    Physical Therapy Treatment Note    Date: 05/22/2022  Patient's Name: Robyn Kim  Date of Birth: 04-12-1970      Visit #/POC: 11/20  Authorization: 11/12 visits;  POC Signed?: no  POC Ends: 06/26/22  Next Progress Note Due: 10th visit or 05/15/22        Evaluating Physical Therapist: Jonell Cluck, PT, DPT  PT diagnosis/Reason for Referral: pain R shoulder (R supraspinatus tendonitis, subacromial impingement)  Next Scheduled Physician Appointment: 06/19/22  Allergies/Contraindications: n        Subjective: Patient reports "at the moment" tingling in digits is resolved, but R distal aspect of index finger has went from tingling to numb. Pain during the day is somewhat improved with pain on average 5/10, at night pain increases to 6-7/10. She reports UT does not feel as tight, but at C/T junction still does.      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 Findings are chronic      Objective:  treatment delivered as noted below     Patient-Specific Functional Score:     Problem (04/28/22)        05/08/22   1. Fishing 0 0   2. ADL's (doing hair, washing hair) 2 3.5   3. Return to full work duty 0 0   TOTAL .66 1.16      Shoulder AROM   RIGHT SHOULDER AROM   04/17/22 (sit) PROM 04/17/22 (Supine) PROM (supine) 05/08/22 AROM   Standing(supine) 05/22/22   Flexion 82 130 (pain and tightness) 115 127 (empty end feel) 100(136) degrees   Abduction 70 degrees 70 degrees  70 degrees (empty end feel) 72(145) degrees   ER Unable  35 (pain and tightness) N/T (50 degrees @ 90 degrees abd)   IR L5 45 (pain and tightness) piriformis R PSIS(45 degrees)              Grip strength     04/17/22 05/08/22              90* elbow bend 8 #  7#                        75* shoulder flexion straight  elbow 5# N/T           EXERCISE/ACTIVITY NAME REPETITIONS RESISTANCE COMPLETED THIS DOS   PROM: all directions        y   R [place] joint mobs: lateral distraction, PA, inferior     Inferior/lateral glides with RUE in abd, ER with elbow flexion     ER with anterior mob    Grade 2   N (tender with inferior glide)        n     n         Kinesio taping: UT and supraspinatus  --GH joint mechanical correction    Inhibit  correction I strip x1 ea  I strip x2 N D/C  N D/C   Reviewed HEP         n   IFC with MH: R GH joint  IFC with CP X10 min  X10 min   N  n   PB rolls on mat table:  --flexion/extension  --1/2 moon rolls  X10  x10      n  n   Passive scap PNF     n   Supine RUE flexion @ 90 degrees with perturbation     n   Supine ceiling punch 2x10   n:     Sidelying: flexion, abd, ER X10, x10, x7   N:     UBE  X5 min L0 n   Pulleys: flexion and scaption X15 ea   n: HEP 05/03/22   Flexion towel slides at door x5 AA with 2 hands n   IR towel stretch X5 with brief hold   No   Supine Shoulder Flexion Overhead with Dowel     n + HEP 05/08/22   Supine Shoulder Abduction AAROM with Dowel     n + HEP 05/08/22   Supine Shoulder Horizontal Abduction Adduction AAROM with Dowel     n + HEP 05/08/22    Supine Shoulder External Rotation AAROM with Dowel (45 * scaption)     n+ HEP 05/08/22   Shoulder Pendulum with Table Support   Flexion-Extension   Horizontal   Circular     n+ HEP 05/08/22   Isometric Shoulder Flexion at Wall     n+ HEP 05/08/22    Isometric Shoulder Extension at Wall      n + HEP 05/08/22   Combo: R UT, supra, levator, infra X8 min   n   Kinesio tape: R UT, supra, levator Inhibit I strip x1 ea   n   Hooklying anterior chest stretch:   Static with larger pool noodle X5 min   n   MFR: R UT, supra, levator, infrap   n   Sidelying R chicken wing x20 AROM only y   Sidelying: RUE flexion  --abd  --ER X10  X10  x10 AROM  AROM  AROM Y  Y  y             Assessment:  Re-assessed ROM and STG's with ROM gains noted both passive and  active. Pain>tightness at end ranges all directions. When assessing standing R shoulder AROM into abd she literally had to hold proximal UE after raising to 72 degrees to give ease to the pain as she states it feels as though she is having muscle spasms. During sidelying flexion>abd at rep 10 she states her thumb is throbbing. RUE shaking noted during L sidelying R AROM exercises d/t muscle weakness.    Short term goals (5 weeks):               1. Patient to decrease pain to <2/10 at worst with (NOT MET 05/22/22)               2. Pt to improve AROM R shoulder flexion to 170 degrees  (NOT MET 05/22/22)               3. Patient to improve AROM of R shoulder abduction to 165 degrees (NOT MET 05/22/22)               4. Patient to improve R grip strength equivalent to L grip strength to aid in ADL's (NOT MET 05/08/22)               5. Patient to ambulate without guarding of R shoulder in ADD/IR with normal arm swing noted. (NOT MET 05/22/22)     Long term goals (10weeks):               1.  Patient to improve PSFS to >7/10 indicating functional improvement ( PROGRESSING 05/08/22)               2. Patient to improve proximal stability of shoulder girdle 4/5 to aid in occupational demands. (NOT MET 05/08/22)               3. Patient to return to occupational demands restriction free (NOT MET 05/08/22)               4. Patient to improve function IR/ER of R shoulder to allow patient to complete ADL's/IADL's without restriction   (NOT MET 05/08/22)     Plan:  Assess patient's response to L sidelying R AROM strengthening exercises and will re-assess LTG's.     Total Session Time 39, Timed code minutes 37, and Untimed code minutes 0  [address]      Benjie Karvonen, PTA  05/22/2022 16:17

## 2022-05-24 ENCOUNTER — Ambulatory Visit
Admission: RE | Admit: 2022-05-24 | Discharge: 2022-05-24 | Disposition: A | Discharge status: Still In Hospital | Payer: Worker's Compensation | Source: Ambulatory Visit | Attending: NURSE PRACTITIONER | Admitting: NURSE PRACTITIONER

## 2022-05-24 NOTE — PT Treatment (Signed)
[facility]  Outpatient Physical Therapy  [place], [address]  [phone]  [phone]    Physical Therapy Treatment Note    Date: 05/24/2022  Patient's Name: Robyn Kim  Date of Birth: 05/27/1970    Visit #/POC: 12/20  Authorization: 12/12 visits;  POC Signed?: no  POC Ends: 06/26/22  Next Progress Note Due: 06/22/22        Evaluating Physical Therapist: Jonell Cluck, PT, DPT  PT diagnosis/Reason for Referral: pain R shoulder (R supraspinatus tendonitis, subacromial impingement)  Next Scheduled Physician Appointment: 06/19/22  Allergies/Contraindications: n        Subjective: Patient saw referring provider today who wants her to continue with PT if she is making improvement in her range. Referring provider gave her an order for an X-ray of cervical region and she goes back 06/07/22. Pain on average is a 5/10 with some altered sensation in R pointer finger and thumb where they almost feel like they are throbbing. Pt takes gabapentin for this.       IMAGING: MRI 04/21/22: Mild supraspinatus tenonditis, mild degenerative changes of the [facility] joint which are minimally impinging on the subacromial space. Findings are chronic      Objective:  Objective measures below followed by review of HEP and education to actively move R UE within pain free ranges of scaption, flexion and abduction.     Patient-Specific Functional Score:  Problem (04/28/22)        05/08/22 05/24/22   1. Household chores (changed on 05/24/22) 0 0 3   2. ADL's (doing hair, washing hair) 2 3.5 3.8   3. Return to full work duty 0 0 2.5   TOTAL .66 1.16 3.1      Shoulder AROM   RIGHT SHOULDER 04/17/22 (sit) 05/22/22 (Standing) 05/24/22 (Standing)   Flexion 82 100 degrees 100 degrees   Abduction 70 degrees 72 degrees 76 degrees   ER Unable  R AC joint - compensations from elbow   IR L5 R PSIS R PSIS      PROM -    RIGHT SHOULDER 04/17/22 (supine) 05/08/22 (Supine) 05/24/22   Flexion 130 (pain and  tightness) 127 (empty end feel) 136 degrees (empty end feel)   Abduction 70 degrees 70 degrees (empty end feel) 91 degrees (empty end feel)   ER 35 (pain and tightness) N/T (50 degrees @ 90 degrees abd)   IR 45 (pain and tightness)  N/T                Grip strength - position 2      04/17/22 05/08/22    05/24/22   90* elbow bend 8 # 7#        10#   90* shoulder flexion straight elbow 5# N/T 7#         *Pt cannot tolerate any anterior chest stretching. Even with modifications to the position pt continues to report burning or N/T in deltoid region of R arm.      EXERCISE/ACTIVITY NAME REPETITIONS RESISTANCE COMPLETED THIS DOS   PROM: all directions        y   R [place] joint mobs: lateral distraction, PA, inferior     Inferior/lateral glides with RUE in abd, ER with elbow flexion     ER with anterior mob    Grade 2   Y         Y     n  Kinesio taping: UT and supraspinatus  --GH joint mechanical correction    Inhibit  correction I strip x1 ea  I strip x2 N D/C  N D/C   Reviewed HEP         Yes    IFC with MH: R GH joint  IFC with CP X10 min  X10 min   N  n   PB rolls on mat table:  --flexion/extension  --1/2 moon rolls       X10  x10      n  n   Passive scap PNF     n   Supine RUE flexion @ 90 degrees with perturbation     n   Supine ceiling punch 2x10   n:     Sidelying: flexion, abd, ER X10, x10, x7   N:     UBE  X5 min L0 n   Pulleys: flexion and scaption X15 ea   n: HEP 05/03/22   Flexion towel slides at door x5 AA with 2 hands n   IR towel stretch X5 with brief hold   No   Supine Shoulder Flexion Overhead with Dowel     n + HEP 05/08/22   Supine Shoulder Abduction AAROM with Dowel     n + HEP 05/08/22   Supine Shoulder Horizontal Abduction Adduction AAROM with Dowel     n + HEP 05/08/22    Supine Shoulder External Rotation AAROM with Dowel (45 * scaption)     n+ HEP 05/08/22   Shoulder Pendulum with Table Support   Flexion-Extension   Horizontal   Circular     n+ HEP 05/08/22   Isometric Shoulder Flexion at Wall     n+  HEP 05/08/22    Isometric Shoulder Extension at Wall      n + HEP 05/08/22   Combo: R UT, supra, levator, infra X8 min   n   Kinesio tape: R UT, supra, levator Inhibit I strip x1 ea   n   Hooklying anterior chest stretch:   Static with larger pool noodle X5 min   n   MFR: R UT, supra, levator, infrap     n   Sidelying R chicken wing x20 AROM only y   Sidelying: RUE flexion  --abd  --ER X10  X10  x10 AROM  AROM  AROM Y  Y  y    AROM within available range for flexion, scaption and abduction  X15 AROM  Yes + Verbal HEP   Supine Pec stretch   Yes + Verbal HEP       Assessment:  Pt has attended 12 PT visits since [facility]. Pt had imaging completed throughout duration with findings listed above. Pt continues to have high pain levels which have no changed much since [facility]. However P/AROM of R shoulder has demonstrated some improvements, but the functional carry over is not seen which continues to be limited by high pain levels and altered sensation. Pt did demonstrate improvement on PSFS and one task was modified to be more appropriate based on progress thus far. Pt holds R UE in adduction and IR and discussed how this can contribute to impingement of the shoulder and encouraged postural awareness. Pt has difficulty tolerating anterior chest stretching coupled with altered paraesthesia down R UE into fingers and in deltoid region inconsistent with shoulder imaging and presentation. Pt does have a cervical fusion which is not new but cervical involvement cannot be ruled out at this time due  to altered paraesthesia and significant weakness in grip strength. Referring provider did order an X-ray of cervical, however MRI would be more selective to ruling in/out cervical involvement. PT to continue progressing R shoulder ROM, strength, stabilization and pain control. If significant progress is not made in the next 4 weeks referral back to PCP will be made for further follow up.       Short term goals (5 weeks):               1. Patient  to decrease pain to <2/10 at worst with (NOT MET 05/24/22)               2. Pt to improve AROM R shoulder flexion to 170 degrees  (NOT MET 05/24/22)               3. Patient to improve AROM of R shoulder abduction to 165 degrees (NOT MET 05/24/22)               4. Patient to improve R grip strength equivalent to L grip strength to aid in ADL's (NOT MET 05/24/22)               5. Patient to ambulate without guarding of R shoulder in ADD/IR with normal arm swing noted. (NOT MET 05/24/22)     Long term goals (10weeks):               1. Patient to improve PSFS to >7/10 indicating functional improvement ( PROGRESSING 05/24/22)               2. Patient to improve proximal stability of shoulder girdle 4/5 to aid in occupational demands. (NOT MET 05/24/22)               3. Patient to return to occupational demands restriction free (NOT MET 05/24/22)               4. Patient to improve function IR/ER of R shoulder to allow patient to complete ADL's/IADL's without restriction   (NOT MET 05/24/22)     Plan:  Continue per current POC 2x/wk for 4 additional weeks. Pending new order and approval from workers comp.    Total Session Time 45 and Timed code minutes 37  THERAPEUTIC EXERCISE 37 minutes      Jonell Cluck, PT  05/24/2022, 15:38

## 2022-05-26 ENCOUNTER — Ambulatory Visit (HOSPITAL_COMMUNITY): Payer: Self-pay

## 2022-06-07 NOTE — Progress Notes (Signed)
Patient ID: Robyn Kim is a 52 y.o. female.    Subjective     Patient presents today for follow-up visit was last seen on 06/14/2021 where she underwent a left L5-S1 TFESI.  Patient reports 100% pain relief for greater than 6 months after this injection.  Patient states that she was doing well up until a few weeks ago when the pain started to flare back up.  The pain is in the same distribution as last time.  The pain radiates from her low back down the lateral aspect of her left lower extremity into the top of the foot.  She has been taking gabapentin for pain relief.  Patient states when she is standing and walking her pain is a 7 out of 10 in severity and she has to take breaks due to the pain. She is currently in physical therapy.  She denies any numbness, tingling, weakness in her left lower extremity.  Patient also endorsing coccygeal pain after a fall approximately 6 months ago.  This pain bothers her when she is sitting down and walking.    Objective     GENERAL: A/O x3, cooperative, NAD  PSYCH: Mood and affect appropriate.  SKIN: No rashes or lesions noted on limited skin exam.   PULM: Respirations regular, non-labored.  MSK: No pain with flexion or extension of the lumbar spine  Hip: No pain with internal/external rotation  NEURO: No focal LE weakness, No LE hyper-reflexia or clonus. No focal sensory deficits to light touch.     Below labs were reviewed in the context of MDM:  No results found for: "HBA1C", "CREATININE"    Imaging:    FL FLUOROSCOPY BY NON RAD < 1 HOUR  This exam was automatically finalized and will not be resulted.      Assessment   1.  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  Coccydynia    Patient underwent a left L5-S1 TFESI on 06/14/2021 with 100% pain relief for almost 1 year.  Patient states the pain flared up several weeks ago in the same distribution as last  time.  The pain radiates down the lateral aspect of the left lower extremity to the top of the foot consistent with the L5 radiculitis.  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  Her pain is severe when she is up and walking and limiting her ability to be active and do the activities she is enjoys doing.  Taking gabapentin for pain relief.  She has been to physical therapy in the past.    Plan   -Proceed with left L5-S1 TFESI      Patient was seen and examined with Dr.Cullen.  I personally obtained the history, examined the patient, and formulated the treatment plan as indicated above.  I agree with the above note and have modified it, where appropriate.   Curt Jews., MD  \       Electronically signed by: Pierre Bali., MD  06/07/22 1556

## 2022-06-09 ENCOUNTER — Ambulatory Visit (HOSPITAL_COMMUNITY)
Admission: RE | Admit: 2022-06-09 | Discharge: 2022-06-09 | Disposition: A | Discharge status: Still In Hospital | Payer: Worker's Compensation | Source: Ambulatory Visit | Attending: NURSE PRACTITIONER | Admitting: NURSE PRACTITIONER

## 2022-06-09 NOTE — PT Treatment (Signed)
[facility]  Outpatient Physical Therapy  [place], [address]  [phone]  [phone]    Physical Therapy Treatment Note    Date: 06/09/2022  Patient's Name: Robyn Kim  Date of Birth: [birth year]      Visit #/POC: 13/20  Authorization: 1/8 visits; (2/26 - 3/23 = Work comp approval)  POC Signed?: NO  POC Ends: 06/26/22  Next Progress Note Due: 06/22/22        Evaluating Physical Therapist: Jonell Cluck, PT, DPT  PT diagnosis/Reason for Referral: R shoulder pain  Next Scheduled Physician Appointment: 06/19/22  Allergies/Contraindications:         Subjective: Patient has not been back in clinic since 05/24/22 due to no work comp authorization received until 06/05/22 in addition initial POC has not been signed. Pt has been consistent with HEP during waiting period for authorization. Yesterday pain was really bad where pt went to [place] as it was unbearable. Pt notes difficulty sitting at desk using her mouse to work noting her R thumb starts throbbing and her hand goes cold. PMH of cervical fusion C5-C6 (2019) and fibromyalgia. Aside from yesterday pain has ranged from 2-8/10. Wilburn Mylar was an 8/10 and today is 5/10. Per pt [facility] is looking to refer her to an ortho as her NP has not taken these actions.       IMAGING: MRI 04/21/22: Mild supraspinatus tenonditis, mild degenerative changes of the [facility] joint which are minimally impinging on the subacromial space. Findings are chronic      Objective:  Warm up on UEB forward/retro x6 mins followed by PROM.     SEE LAST PROGRESS NOTE/VISIT FOR OBJECTIVE MEASUREMENTS. NONE RE-ASSESSED ON 06/09/22 AS PT IS TRANSITIONING TO NEW PT.        EXERCISE/ACTIVITY NAME REPETITIONS RESISTANCE COMPLETED THIS DOS   PROM: all directions       Emphasis on EROM- Guarded y   R GH joint mobs: lateral distraction, PA, inferior     Inferior/lateral glides with RUE in abd, ER with elbow flexion     ER with anterior mob    Grade  2   N        N     n         Kinesio taping: UT and supraspinatus  --GH joint mechanical correction    Inhibit  correction I strip x1 ea  I strip x2 N D/C  N D/C   Reviewed HEP         Yes    IFC with MH: R GH joint  IFC with CP X10 min  X10 min   N  n   PB rolls on mat table:  --flexion/extension  --1/2 moon rolls       X10  x10      n  n   Passive scap PNF     n   Supine RUE flexion @ 90 degrees with perturbation     n   Supine ceiling punch 2x10   n:     Sidelying: flexion, abd, ER X10, x10, x7   N:     UBE  X5 min L0 Yes   Pulleys: flexion and scaption X15 ea   n: HEP 05/03/22   Flexion towel slides at door x5 AA with 2 hands n   IR towel stretch X5 with brief hold   No   Supine Shoulder Flexion Overhead with Dowel     n +  HEP 05/08/22   Supine Shoulder Abduction AAROM with Dowel     n + HEP 05/08/22   Supine Shoulder Horizontal Abduction Adduction AAROM with Dowel     n + HEP 05/08/22    Supine Shoulder External Rotation AAROM with Dowel (45 * scaption)     n+ HEP 05/08/22   Shoulder Pendulum with Table Support   Flexion-Extension   Horizontal   Circular     n+ HEP 05/08/22   Isometric Shoulder Flexion at Wall     n+ HEP 05/08/22    Isometric Shoulder Extension at Wall      n + HEP 05/08/22   Combo: R UT, supra, levator, infra X8 min   n   Kinesio tape: R UT, supra, levator Inhibit I strip x1 ea   n   Hooklying anterior chest stretch:   Static with larger pool noodle X5 min   n   MFR: R UT, supra, levator, infrap     n   Sidelying R chicken wing x20 AROM only n   Sidelying: RUE   -- flexion  --abd  --ER X10  X10  x10 AROM  AROM  AROM Y  Y  y    AROM within available range for flexion, scaption and abduction  X15 AROM  Yes + Verbal HEP   Supine Pec stretch     No    Shoulder External Rotation Reactive Isometrics  x15 Yellow Yes + Hep 06/09/22   Shoulder Internal Rotation Reactive Isometrics   x15 Yellow Yes + HEP 06/09/22   Shoulder Extension Reactive Isometrics with Elbow at 90  x15 Yellow Yes + HEP 06/09/22   Standing  Shoulder Flexion Reactive Isometric   x15 Yellow Yes + HEP 06/09/22     Access Code: [email or web address]  URL: [email or web address]  Date: 06/09/2022  Prepared by: Jonell Cluck    Exercises  - Shoulder External Rotation Reactive Isometrics  - 1 x daily - 3 x weekly - 2 sets - 15 reps  - Shoulder Internal Rotation Reactive Isometrics  - 3 x weekly - 2 sets - 15 reps  - Shoulder Extension Reactive Isometrics with Elbow at 90  - 3 x weekly - 2 sets - 15 reps  - Standing Shoulder Flexion Reactive Isometric  - 3 x weekly - 2 sets - 15 reps    Assessment:  Pt is still very guarded as EROM is approached with facial grimacing and wincing. Pt reported a lot of burning in deltoid region as EROM was reached. Progressed isometrics with reactive isometric walk outs with yellow TB and provided for HEP.        Short term goals (5 weeks):               1. Patient to decrease pain to <2/10 at worst with (NOT MET 05/24/22)               2. Pt to improve AROM R shoulder flexion to 170 degrees  (NOT MET 05/24/22)               3. Patient to improve AROM of R shoulder abduction to 165 degrees (NOT MET 05/24/22)               4. Patient to improve R grip strength equivalent to L grip strength to aid in ADL's (NOT MET 05/24/22)               5. Patient to ambulate without guarding of R shoulder  in ADD/IR with normal arm swing noted. (NOT MET 05/24/22)     Long term goals (10weeks):               1. Patient to improve PSFS to >7/10 indicating functional improvement ( PROGRESSING 05/24/22)               2. Patient to improve proximal stability of shoulder girdle 4/5 to aid in occupational demands. (NOT MET 05/24/22)               3. Patient to return to occupational demands restriction free (NOT MET 05/24/22)               4. Patient to improve function IR/ER of R shoulder to allow patient to complete ADL's/IADL's without restriction   (NOT MET 05/24/22)     Plan:  Re-assessment as pt is transitioning to new PT who may have further recommendations or  modifications to current POC. Assess response to reactive isometrics added last visit.     Total Session Time 41 and Timed code minutes 37  THERAPEUTIC EXERCISE 37 minutes      Jonell Cluck, PT  06/09/2022, 15:06

## 2022-06-12 ENCOUNTER — Other Ambulatory Visit: Payer: Self-pay

## 2022-06-12 ENCOUNTER — Ambulatory Visit (HOSPITAL_COMMUNITY)
Admission: RE | Admit: 2022-06-12 | Discharge: 2022-06-12 | Disposition: A | Discharge status: Still In Hospital | Payer: Worker's Compensation | Source: Ambulatory Visit | Attending: NURSE PRACTITIONER | Admitting: NURSE PRACTITIONER

## 2022-06-12 NOTE — PT Treatment (Signed)
[facility]  Outpatient Physical Therapy  [place], [address]  [phone]  [phone]    Physical Therapy Treatment Note    Date: 06/12/2022  Patient's Name: Robyn Kim  Date of Birth: 08-13-1970      Visit #/POC: 14/20  Authorization: 2/8 visits; (2/26 - 3/23 = Work comp approval)  POC Signed?: NO  POC Ends: 06/26/22  Next Progress Note Due: 06/22/22        Evaluating Physical Therapist:  the pt's care is now transferred from Jonell Cluck, PT, DPT to myself  Leanor Rubenstein PT  PT diagnosis/Reason for Referral: R shoulder pain  Next Scheduled Physician Appointment: 06/14/22  Allergies/Contraindications: NONE        Subjective: she is working light duty ( desk work) . She does not see an improvement in her symptoms although her mobility is better. She is trying to get an orthopedic consult.  She has not had an injection.      Objective:    Per EMR,  IMAGING: MRI 04/21/22:  EMR  documents " Mild supraspinatus tenonditis, mild degenerative changes of the [facility] joint which are minimally impinging on the subacromial space. Findings are chronic "  SEE LAST PROGRESS NOTE/VISIT FOR OBJECTIVE MEASUREMENTS. NONE RE-ASSESSED ON 06/09/22 AS PT IS TRANSITIONING TO NEW PT.     Shoulder A/PROM ( taken from original provider's measurements)   RIGHT SHOULDER 04/17/22 (sit) 05/22/22 (Standing) 05/24/22 (Standing)   Flexion 82/130 100 degrees 100 /136 degrees   Abduction 70/70 degrees 72 degrees 76 /90 degrees   ER Unable   R AC joint - compensations from elbow   IR L5/45 degrees  R PSIS R PSIS             The pt was reassessed today by her new provider. 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 The pt was allowed to sit at a properly designed work station for purposes of Neurosurgeon at computer station. Recommendations were made to pt regarding use of wedge seat and lumbar roll to improve sitting posture and use of a pull out key board and  mouse tray to  lower them to a proper height. The pt's was instructed on effective rotator cuff ER stretch and pectoral stretch as part of her HEP.    HEP;     Access Code: [email or web address]  URL: [email or web address]  Date: 06/12/2022  Prepared by: Leanor Rubenstein    Exercises  - Supine anterior chest release ( gravity facilitated pectoral stretch in supine) Towel Roll Horizontal with Arm Stretch  - 2 x daily - 7 x weekly - 1 sets - 1 reps - 5-10 minutes hold  - supine pectoralis minor  stretch  with LTR  - 1 x daily - 7 x weekly - 3 sets - 10 reps    Assessment:  the pt presents with a forward head and shoulder posture with notable adaptive shortening or right pectoralis minor and dorsal scapular weakness. 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 Physical therapy impression is that of postural dysfunction and postural impingement syndrome. The pt is able to do her current HEP without aggravating her symptoms but she does not admit to much change in acuity of her pain. She might benefit from an orthopedic consult and a cortisone injection due to persistent pain that is not alleviated with therapy to date.      Short term goals (5 weeks):  1. Patient to decrease pain to <2/10 at worst with (NOT MET 05/24/22)               2. Pt to improve AROM R shoulder flexion to 170 degrees  (NOT MET 05/24/22)               3. Patient to improve AROM of R shoulder abduction to 165 degrees (NOT MET 05/24/22)               4. Patient to improve R grip strength equivalent to L grip strength to aid in ADL's (NOT MET 05/24/22)               5. Patient to ambulate without guarding of R shoulder in ADD/IR with normal arm swing noted. (NOT MET 05/24/22)     Long term goals (10weeks):               1. Patient to improve PSFS to >7/10 indicating functional improvement ( PROGRESSING 05/24/22)               2. Patient to improve proximal stability of shoulder girdle 4/5 to aid in occupational demands. (NOT MET 05/24/22)                3. Patient to return to occupational demands restriction free (NOT MET 05/24/22)               4. Patient to improve function IR/ER of R shoulder to allow patient to complete ADL's/IADL's without restriction   (NOT MET 05/24/22)        Plan:  the pt will FU with her PCP this week. Await MD recommendations. She has 6 sessions remaining per current authorization.     Total Session Time 46 and Timed code minutes 46  THERAPEUTIC EXERCISE 25 minutes and THERAPEUTIC ACTIVITY 20 minutes      Leanor Rubenstein, PT  06/12/2022, 16:07

## 2022-06-14 ENCOUNTER — Ambulatory Visit
Admission: RE | Admit: 2022-06-14 | Discharge: 2022-06-14 | Disposition: A | Discharge status: Still In Hospital | Payer: Worker's Compensation | Source: Ambulatory Visit | Attending: NURSE PRACTITIONER | Admitting: NURSE PRACTITIONER

## 2022-06-14 ENCOUNTER — Other Ambulatory Visit: Payer: Self-pay

## 2022-06-14 NOTE — PT Treatment (Signed)
[facility]  Outpatient Physical Therapy  [place], [address]  [phone]  [phone]    Physical Therapy Treatment Note    Date: 06/14/2022  Patient's Name: Robyn Kim  Date of Birth: October 19, 1970      Visit #/POC: 15/20  Authorization: 3/8 visits; (2/26 - 3/23 = Work comp approval)  POC Signed?: NO  POC Ends: 06/26/22  Next Progress Note Due: 06/22/22        Evaluating Physical Therapist:  the pt's care is now transferred from Jonell Cluck, PT, DPT to myself  Leanor Rubenstein PT  PT diagnosis/Reason for Referral: R shoulder pain  Next Scheduled Physician Appointment: 06/28/22  Allergies/Contraindications: NONE             Subjective: Pt states she did see doctor today.  Notes she did receive steroid injection (general steroid not in shoulder).  Notes she will return to doctor in 2 weeks.  Doctor is requesting x-ray for her neck but it has not been approved. Notes shoulder pain is about the same since starting therapy. States she did make some adjustments to her workstation.  Rates pain 8/10 at worst over past week.  Pain remains constant and gets as low as 3/10 at rest.  States she is doing exercise at home as advised.     Objective:  Activities as noted below.      Measured ROM: not measured today.      EXERCISE/ACTIVITY NAME REPETITIONS RESISTANCE COMPLETED THIS DOS   PROM: all directions       Emphasis on EROM- Guarded y   R GH joint mobs: lateral distraction, PA, inferior     Inferior/lateral glides with RUE in abd, ER with elbow flexion     ER with anterior mob    Grade 2   N        N     n         Kinesio taping: UT and supraspinatus  --GH joint mechanical correction    Inhibit  correction I strip x1 ea  I strip x2 N D/C  N D/C   Reviewed HEP         Yes    IFC with MH: R GH joint  IFC with CP X10 min  X10 min   N  n   PB rolls on mat table:  --flexion/extension  --1/2 moon rolls       X10  x10      n  n   Passive scap PNF     n   Supine RUE  flexion @ 90 degrees with perturbation     n   Supine ceiling punch 2x10   n:     Sidelying: flexion, abd, ER X10, x10, x7   N:     UBE  X5 min L0 no   Pulleys: flexion and scaption X15 ea   n: HEP 05/03/22   Flexion towel slides at door x5 AA with 2 hands n   IR towel stretch X5 with brief hold   No   Supine Shoulder Flexion Overhead with Dowel     n + HEP 05/08/22   Supine Shoulder Abduction AAROM with Dowel     n + HEP 05/08/22   Supine Shoulder Horizontal Abduction Adduction AAROM with Dowel     n + HEP 05/08/22    Supine Shoulder External Rotation AAROM with Dowel (45 * scaption)     n+ HEP  05/08/22   Shoulder Pendulum with Table Support   Flexion-Extension   Horizontal   Circular     n+ HEP 05/08/22   Isometric Shoulder Flexion at Wall     n+ HEP 05/08/22    Isometric Shoulder Extension at Wall      n + HEP 05/08/22   Combo: R UT, supra, levator, infra X8 min   n   Kinesio tape: R UT, supra, levator Inhibit I strip x1 ea   n   Hooklying anterior chest stretch:   Static with larger pool noodle X5 min   n   MFR: R UT, supra, levator, infrap     n   Sidelying R chicken wing x20 AROM only n   Sidelying: RUE   -- flexion  --abd  --ER X10  X10  x10 AROM  AROM  AROM Y  Y  y    AROM within available range for flexion, scaption and abduction  X15 AROM  n + Verbal HEP   Supine Pec stretch     No    Shoulder External Rotation Reactive Isometrics  x15 Yellow n + Hep 06/09/22   Shoulder Internal Rotation Reactive Isometrics   x15 Yellow n+ HEP 06/09/22   Shoulder Extension Reactive Isometrics with Elbow at 90  x15 Yellow n + HEP 06/09/22   Standing Shoulder Flexion Reactive Isometric   x15 Yellow n + HEP 06/09/22     Access Code: [email or web address]    Assessment: Pt tolerated treatment well.  She is guarded with movement but more limited by pain vs joint/soft tissue tightness.      Short term goals (5 weeks):               1. Patient to decrease pain to <2/10 at worst with (NOT MET 05/24/22)               2. Pt to improve AROM R shoulder flexion to  170 degrees  (NOT MET 05/24/22)               3. Patient to improve AROM of R shoulder abduction to 165 degrees (NOT MET 05/24/22)               4. Patient to improve R grip strength equivalent to L grip strength to aid in ADL's (NOT MET 05/24/22)               5. Patient to ambulate without guarding of R shoulder in ADD/IR with normal arm swing noted. (NOT MET 05/24/22)     Long term goals (10weeks):               1. Patient to improve PSFS to >7/10 indicating functional improvement ( PROGRESSING 05/24/22)               2. Patient to improve proximal stability of shoulder girdle 4/5 to aid in occupational demands. (NOT MET 05/24/22)               3. Patient to return to occupational demands restriction free (NOT MET 05/24/22)               4. Patient to improve function IR/ER of R shoulder to allow patient to complete ADL's/IADL's without restriction   (NOT MET 05/24/22)           Plan: Will resume full exercise next visit    Total Session Time 35 and Timed code minutes 35  THERAPEUTIC EXERCISE 35 minutes  Zyheir Daft, PTA  06/14/2022, 14:49

## 2022-06-15 IMAGING — DX XRAY CERVICAL SPINE MINIMUM 4 VIEWS
1 series · 6 of 6 positions shown · non-contrast
Comparison: C-spine MRI 03/27/2018.

﻿EXAM:  12686      XRAY CERVICAL SPINE MINIMUM 4 VIEWS
INDICATION: Neck pain.  Previous surgery.  Radicular symptoms to right arm.  Numbness.

[Series 1: AP · 0.14mm/px · 6 of 6 slices shown]
[im 1/6]
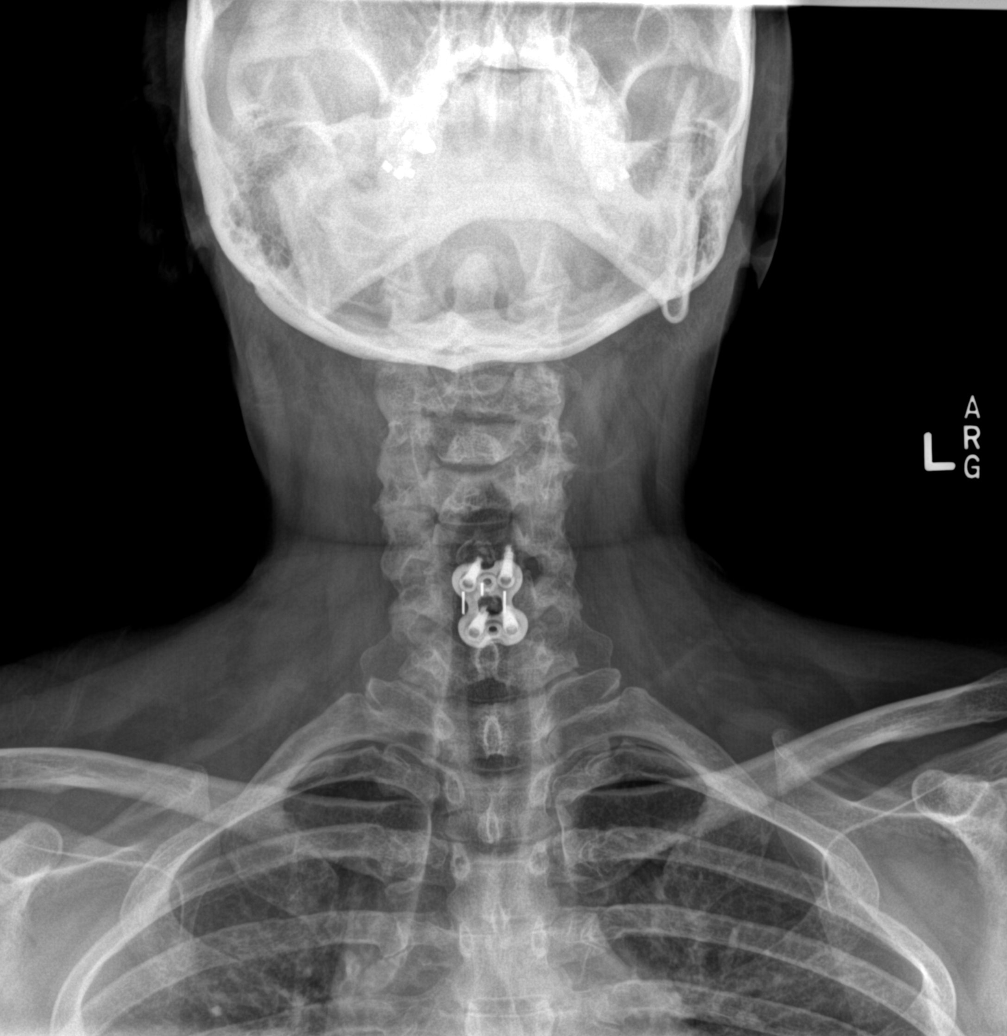
[im 2/6]
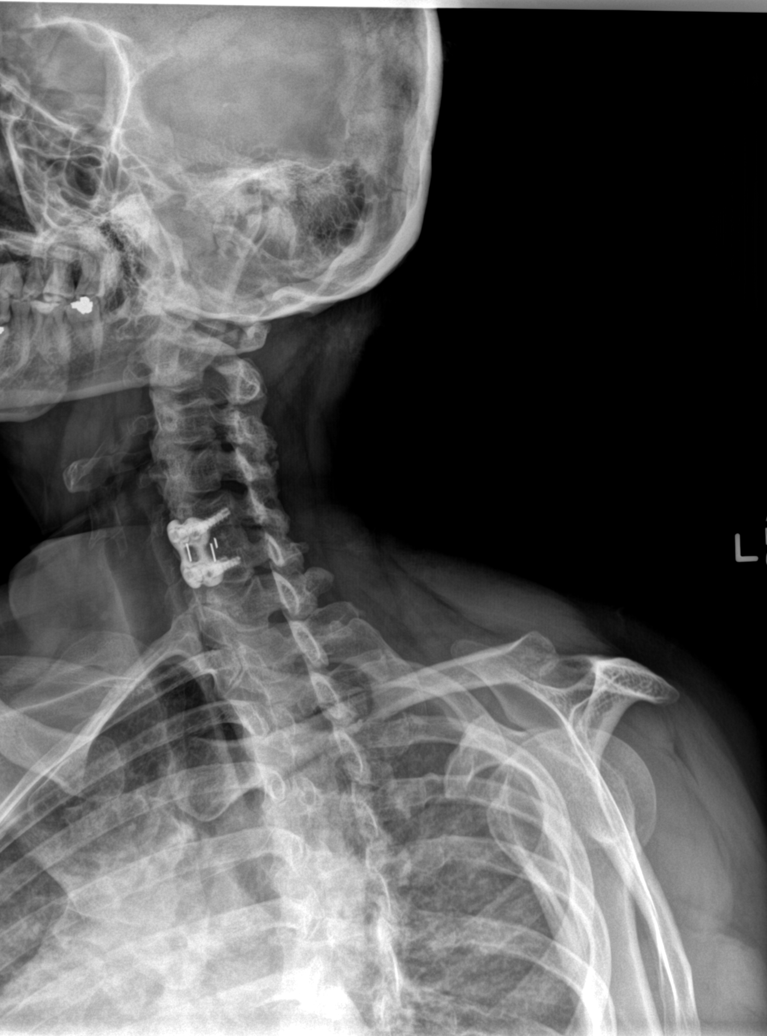
[im 3/6]
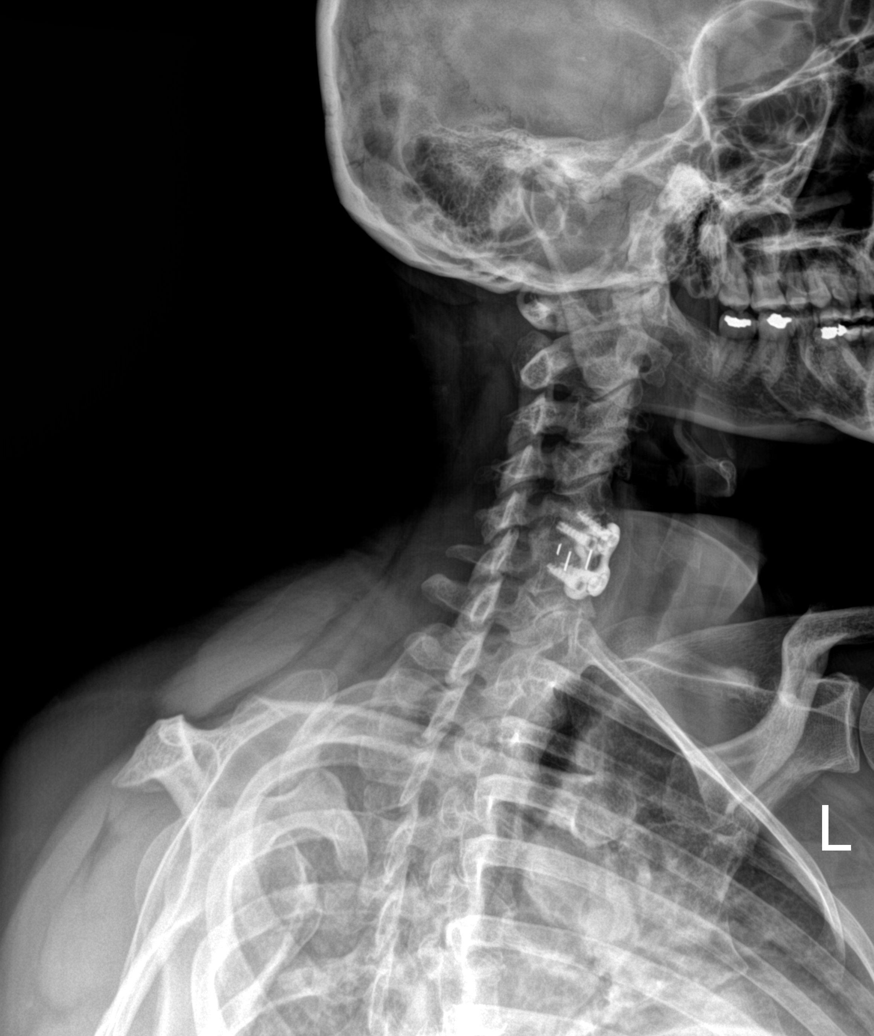
[im 4/6]
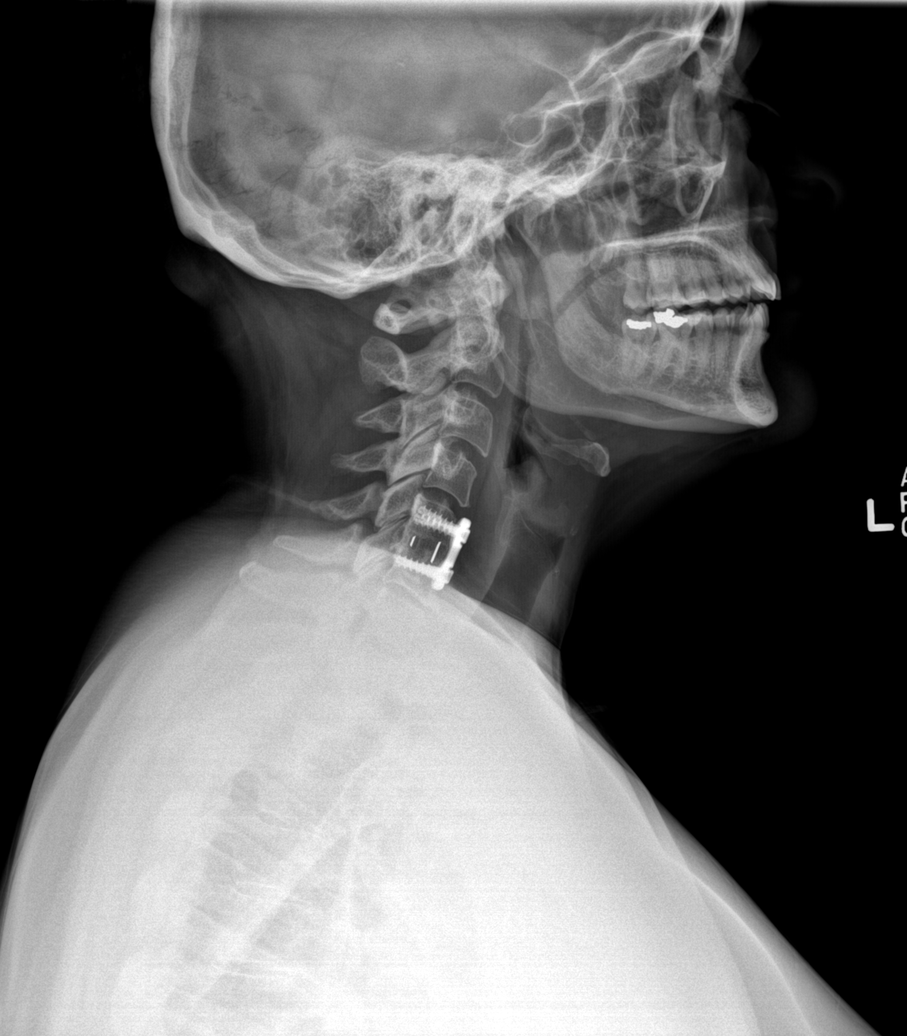
[im 5/6]
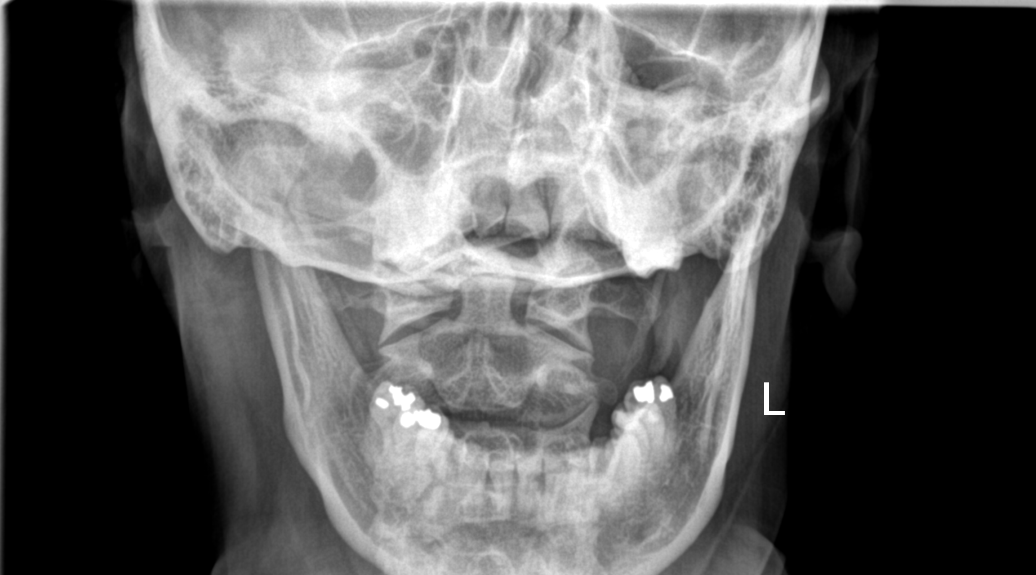
[im 6/6]
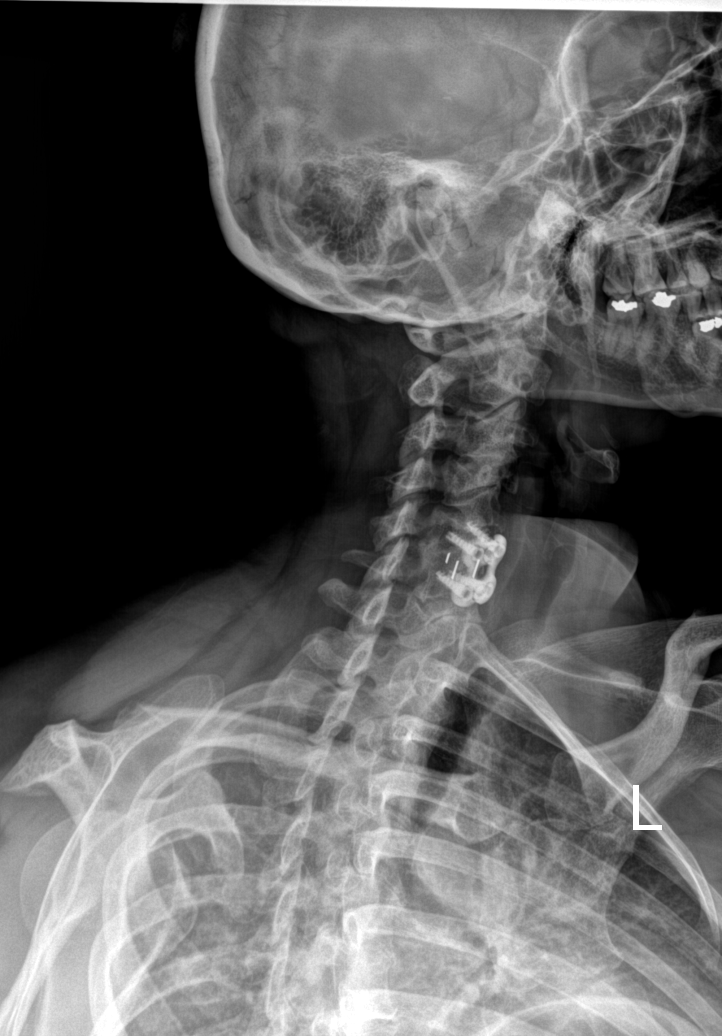

[6 of 6 positions shown; findings below may reference images not displayed]

FINDINGS: No acute bony lesions of cervical vertebrae are seen.  

Postoperative changes at C5-6 level with anterior fusion. Alignment of the vertebrae is normal.  Remaining disc spaces are normal.  Mild compromise of neural foramina at C5-6 level by osteophyte complex is noted on the oblique views.
IMPRESSION: 1. No acute findings.

2. Postoperative changes of C5-6 disc.  Mild compromise of neural foramina by osteophyte complex at this level.

## 2022-06-19 ENCOUNTER — Ambulatory Visit (HOSPITAL_COMMUNITY): Payer: Self-pay

## 2022-06-22 ENCOUNTER — Other Ambulatory Visit: Payer: Self-pay

## 2022-06-22 ENCOUNTER — Ambulatory Visit (HOSPITAL_COMMUNITY)
Admission: RE | Admit: 2022-06-22 | Discharge: 2022-06-22 | Disposition: A | Discharge status: Still In Hospital | Payer: Worker's Compensation | Source: Ambulatory Visit | Attending: NURSE PRACTITIONER | Admitting: NURSE PRACTITIONER

## 2022-06-22 DIAGNOSIS — S46911A Strain of unspecified muscle, fascia and tendon at shoulder and upper arm level, right arm, initial encounter: Secondary | ICD-10-CM

## 2022-06-22 DIAGNOSIS — M25511 Pain in right shoulder: Secondary | ICD-10-CM | POA: Insufficient documentation

## 2022-06-22 NOTE — PT Treatment (Signed)
[facility]  Outpatient Physical Therapy  [place], [address]  [phone]  [phone]    Physical Therapy Treatment Note    Date: 06/22/2022  Patient's Name: Robyn Kim  Date of Birth: [birth year]  Physical Therapy Visit      Visit #/POC: 16/20  Authorization: 4/8 visits; (2/26 - 3/23 = Work comp approval)  POC Signed?: yes  POC Ends: 06/26/22  Next Progress Note Due: 06/22/22        Evaluating Physical Therapist:  the pt's care is now transferred from Jonell Cluck, PT, DPT to myself  Leanor Rubenstein PT  PT diagnosis/Reason for Referral: R shoulder pain  Next Scheduled Physician Appointment: 06/28/22  Allergies/Contraindications: NONE           Subjective: Pt states  her pull out tray for the desk at work is ordered.She had a cervical x ray at an outside facility which showed mild foraminal narrowing at C5/6 . MD wants to get a cervical MRI  authorized.     Objective:  Activities as noted below.       AROM: 110 degrees flexion ( weakness) , 80 degrees abd ( stiffness and pain noted) , 40 degrees ER , active IR THUMB TO  INFERIOR R SI , 25 DEGREES EXTENSION   MMT ; WEAK Biceps and triceps noted with  MMT.   EXERCISE/ACTIVITY NAME REPETITIONS RESISTANCE COMPLETED THIS DOS   PROM: all directions       Emphasis on EROM- Guarded y   R GH joint mobs: lateral distraction, PA, inferior     Inferior/lateral glides with RUE in abd, ER with elbow flexion     ER with anterior mob    Grade 2   N        N     n         Kinesio taping: UT and supraspinatus  --GH joint mechanical correction    Inhibit  correction I strip x1 ea  I strip x2 N D/C  N D/C   Reviewed HEP         Yes    IFC with MH: R GH joint  IFC with CP X10 min  X10 min   N  n   PB rolls on mat table:  --flexion/extension  --1/2 moon rolls       X10  x10      n  n   Passive scap PNF     n   Supine RUE flexion @ 90 degrees with perturbation     n   Supine ceiling punch 2x10   n:     Sidelying: flexion,  abd, ER X10, x10, x7   N:     UBE  X5 min L0 no   Pulleys: flexion and scaption X15 ea   n: HEP 05/03/22   Flexion towel slides at door x5 AA with 2 hands n   IR towel stretch X5 with brief hold   No   Supine Shoulder Flexion Overhead with Dowel     n + HEP 05/08/22   Supine Shoulder Abduction AAROM with Dowel     n + HEP 05/08/22   Supine Shoulder Horizontal Abduction Adduction AAROM with Dowel     n + HEP 05/08/22    Supine Shoulder External Rotation AAROM with Dowel (45 * scaption)     n+ HEP 05/08/22   Shoulder Pendulum with Table Support   Flexion-Extension   Horizontal  Circular     n+ HEP 05/08/22   Isometric Shoulder Flexion at Wall     n+ HEP 05/08/22    Isometric Shoulder Extension at Wall      n + HEP 05/08/22   Combo: R UT, supra, levator, infra X8 min   n   Kinesio tape: R UT, supra, levator Inhibit I strip x1 ea   n   Hooklying anterior chest stretch:   Static with larger pool noodle X5 min   n   MFR: R UT, supra, levator, infrap     n   Sidelying R chicken wing x20 AROM only n   Sidelying: RUE   -- flexion  --abd  --ER X10  X10  x10 AROM  AROM  AROM Y  Y  y    AROM within available range for flexion, scaption and abduction  X15 AROM  n + Verbal HEP   Supine Pec stretch     No    Shoulder External Rotation Reactive Isometrics  x15 Yellow n + Hep 06/09/22   Shoulder Internal Rotation Reactive Isometrics   x15 Yellow n+ HEP 06/09/22   Shoulder Extension Reactive Isometrics with Elbow at 90  x15 Yellow n + HEP 06/09/22   Standing Shoulder Flexion Reactive Isometric   x15 Yellow n + HEP 06/09/22     Access Code: [email or web address]     Assessment: Pt tolerated treatment well.  She is guarded with movement but more limited by pain vs joint/soft tissue tightness.       Short term goals (5 weeks):               1. Patient to decrease pain to <2/10 at worst with (NOT MET 05/24/22)               2. Pt to improve AROM R shoulder flexion to 170 degrees  (NOT MET 05/24/22)               3. Patient to improve AROM of R shoulder abduction to  165 degrees (NOT MET 05/24/22)               4. Patient to improve R grip strength equivalent to L grip strength to aid in ADL's (NOT MET 05/24/22)               5. Patient to ambulate without guarding of R shoulder in ADD/IR with normal arm swing noted. (NOT MET 05/24/22)     Long term goals (10weeks):               1. Patient to improve PSFS to >7/10 indicating functional improvement ( PROGRESSING 05/24/22)               2. Patient to improve proximal stability of shoulder girdle 4/5 to aid in occupational demands. (NOT MET 05/24/22)               3. Patient to return to occupational demands restriction free (NOT MET 05/24/22)               4. Patient to improve function IR/ER of R shoulder to allow patient to complete ADL's/IADL's without restriction   (NOT MET 05/24/22)      Visit #/POC:***  Authorization:***  POC Signed?: ***  POC Ends: ***  Next Progress Note Due: ***      Evaluating Physical Therapist: ***  PT diagnosis/Reason for Referral: ***  Next Scheduled Physician Appointment: ***  Allergies/Contraindications: ***  Subjective: ***    Objective: ***    Measured ROM: ***  EXERCISE/ACTIVITY NAME REPETITIONS RESISTANCE COMPLETED THIS DOS   ***   *** *** ***   ***   *** *** ***   ***   *** *** ***   ***   *** *** ***   ***   *** *** ***   ***   *** *** ***   ***   *** *** ***   ***   *** *** ***   ***   *** *** ***         Assessment: ***    Plan: ***    {PRN Timed/Untimed billable minutes:43252}  {INTERVENTION MINUTES:42227}      Leanor Rubenstein, PT  06/22/2022, 15:16

## 2022-06-26 ENCOUNTER — Other Ambulatory Visit: Payer: Self-pay

## 2022-06-26 ENCOUNTER — Ambulatory Visit (HOSPITAL_COMMUNITY)
Admission: RE | Admit: 2022-06-26 | Discharge: 2022-06-26 | Disposition: A | Discharge status: Still In Hospital | Payer: Worker's Compensation | Source: Ambulatory Visit | Attending: NURSE PRACTITIONER | Admitting: NURSE PRACTITIONER

## 2022-06-26 NOTE — PT Treatment (Signed)
[facility]  Outpatient Physical Therapy  [place], [address]  [phone]  [phone]    Physical Therapy Treatment Note    Date: 06/26/2022  Patient's Name: Robyn Kim  Date of Birth: 11-Jul-1970  Physical Therapy Visit      Visit #/POC: 17/20  Authorization: 5/8 visits; (2/26 - 3/23 = Work comp approval)  POC Signed?: yes  POC Ends: 06/26/22  Next Progress Note Due: 06/22/22        Evaluating Physical Therapist:    Leanor Rubenstein PT  PT diagnosis/Reason for Referral: R shoulder pain/ strain  Next Scheduled Physician Appointment: 06/28/22  Allergies/Contraindications: NONE             Subjective: Pt reports no lasting relief from treatment so far.  Notes she woke up in middle of the night last night and moved wrong.  States greatly increased pain today.  Rates pain 6/10 today.  Notes none of the treatments have helped for any period of time but last session did feel good for a while.     Objective:  Activities as noted below.     Measured ROM: done last visit      EXERCISE/ACTIVITY NAME REPETITIONS RESISTANCE COMPLETED THIS DOS   PROM: all directions       Emphasis on EROM- Guarded y   R GH joint mobs: lateral distraction, PA, inferior     Inferior/lateral glides with RUE in abd, ER with elbow flexion     ER with anterior mob    Grade 2   yes        N     n         Kinesio taping: UT and supraspinatus  --GH joint mechanical correction  Inhibit  correction I strip x1 ea  I strip x2 N D/C  N D/C   Reviewed HEP       Yes    IFC with MH: R GH joint  IFC with CP X10 min  X10 min   N  n   PB rolls on mat table:  --flexion/extension  --1/2 moon rolls     X10  x10      n  n   Passive scap PNF     n   Supine RUE flexion @ 90 degrees with perturbation     n   Supine ceiling punch 2x10   n:     Sidelying: flexion, abd, ER X10, x10, x7   N:     UBE  X5 min L0 no   Pulleys: flexion and scaption X15 ea   n: HEP 05/03/22   Flexion towel slides at door x5 AA with 2  hands n   IR towel stretch X5 with brief hold   No   Supine Shoulder Flexion Overhead with Dowel     n + HEP 05/08/22   Supine Shoulder Abduction AAROM with Dowel     n + HEP 05/08/22   Supine Shoulder Horizontal Abduction Adduction AAROM with Dowel     n + HEP 05/08/22    Supine Shoulder External Rotation AAROM with Dowel (45 * scaption)     n+ HEP 05/08/22   Shoulder Pendulum with Table Support :Flexion-Extension   Horizontal   Circular     n+ HEP 05/08/22   Isometric Shoulder Flexion at Wall     n+ HEP 05/08/22    Isometric Shoulder Extension at Wall      n +  HEP 05/08/22   Hooklying anterior chest stretch:   Static with larger pool noodle X5 min   n   MFR: R UT, supra, levator, infrap     n   Sidelying R chicken wing x20 AROM only n   Sidelying: RUE   -- flexion  --abd  --ER X10  X10  x10 AROM  AROM  AROM Y  Y  y    AROM within available range for flexion, scaption and abduction  X15 AROM  n + Verbal HEP   Supine Pec stretch     No    Shoulder External Rotation Reactive Isometrics  x15 Yellow n + Hep 06/09/22   Shoulder Internal Rotation Reactive Isometrics   x15 Yellow n+ HEP 06/09/22   Shoulder Extension Reactive Isometrics with Elbow at 90  x15 Yellow n + HEP 06/09/22   Standing Shoulder Flexion Reactive Isometric   x15 Yellow n + HEP 06/09/22         Assessment: Pt tolerated treatment fair.  Continues with pain and limited function.      Short term goals (5 weeks):               1. Patient to decrease pain to <2/10 at worst with (NOT MET 05/24/22)               2. Pt to improve AROM R shoulder flexion to 170 degrees  (NOT MET 05/24/22)               3. Patient to improve AROM of R shoulder abduction to 165 degrees (NOT MET 05/24/22)               4. Patient to improve R grip strength equivalent to L grip strength to aid in ADL's (NOT MET 05/24/22)               5. Patient to ambulate without guarding of R shoulder in ADD/IR with normal arm swing noted. (NOT MET 05/24/22)     Long term goals (10weeks):               1. Patient  to improve PSFS to >7/10 indicating functional improvement ( PROGRESSING 05/24/22)               2. Patient to improve proximal stability of shoulder girdle 4/5 to aid in occupational demands. (NOT MET 05/24/22)               3. Patient to return to occupational demands restriction free (NOT MET 05/24/22)               4. Patient to improve function IR/ER of R shoulder to allow patient to complete ADL's/IADL's without restriction   (NOT MET 05/24/22)              Plan: PT to reassess for last visit    Total Session Time 30 and Timed code minutes 30  THERAPEUTIC EXERCISE 30 minutes      Autrey Human, PTA  06/26/2022, 15:24

## 2022-06-29 ENCOUNTER — Other Ambulatory Visit: Payer: Self-pay

## 2022-06-29 ENCOUNTER — Ambulatory Visit
Admission: RE | Admit: 2022-06-29 | Discharge: 2022-06-29 | Disposition: A | Discharge status: Still In Hospital | Payer: Worker's Compensation | Source: Ambulatory Visit | Attending: NURSE PRACTITIONER | Admitting: NURSE PRACTITIONER

## 2022-06-29 DIAGNOSIS — S46911D Strain of unspecified muscle, fascia and tendon at shoulder and upper arm level, right arm, subsequent encounter: Secondary | ICD-10-CM

## 2022-06-29 NOTE — PT Treatment (Addendum)
[facility]  Outpatient Physical Therapy  [address]  [place], [address]  [phone]  (Fax) [phone]    Physical Therapy Treatment Note    Date: 06/29/2022  Patient's Name: Robyn Kim  Date of Birth: [birth year]  Physical Therapy Visit    10/02/22 ADDENDUM: THIS PT WAS NOT REFERRED BACK TO THERAPY AFTER HER FU WITH HER REFERRING MD.  SEE THE BELOW NOTE FOR HER STATUS UPON HER LAST SESSION. THE PT IS DISCHARGED. Lunette Stands, PT     Visit #/POC: 18/20  Authorization:6/8 visits; (2/26 - 3/23 = Work comp approval)  POC Signed?: yes  POC Ends: 06/26/22  Next Progress Note Due: 06/22/22        Evaluating Physical Therapist:    Lunette Stands PT  PT diagnosis/Reason for Referral: R shoulder pain/ strain  Next Scheduled Physician Appointment: 06/28/22  Allergies/Contraindications: NONE                 Subjective: the pt reports short term relief only after therapy. Her doctor is trying to get a cervical MRI authorized. Per pt, MD agrees with placing therapy on hold until more diagnostic testing is performed    Objective:  Activities as noted below.       EXERCISE/ACTIVITY NAME REPETITIONS RESISTANCE COMPLETED THIS DOS   PROM: all directions       Emphasis on EROM- Guarded y   R GH joint mobs: lateral distraction, PA, inferior     Inferior/lateral glides with RUE in abd, ER with elbow flexion     ER with anterior mob    Grade 2   yes        N     n         Kinesio taping: UT and supraspinatus  --GH joint mechanical correction  Inhibit  correction I strip x1 ea  I strip x2 N D/C  N D/C   Reviewed HEP       Yes    IFC with MH: R GH joint  IFC with CP X10 min  X10 min   N  n   PB rolls on mat table:  --flexion/extension  --1/2 moon rolls     X10  x10      n  n   Passive scap PNF     n   Supine RUE flexion @ 90 degrees with perturbation     n   Supine ceiling punch 2x10   n:     Sidelying: flexion, abd, ER X10, x10, x7   N:     UBE  X5 min L0 no   Pulleys: flexion and scaption X15  ea   n: HEP 05/03/22   Flexion towel slides at door x5 AA with 2 hands n   IR towel stretch X5 with brief hold   No   Supine Shoulder Flexion Overhead with Dowel     n + HEP 05/08/22   Supine Shoulder Abduction AAROM with Dowel     n + HEP 05/08/22   Supine Shoulder Horizontal Abduction Adduction AAROM with Dowel     n + HEP 05/08/22    Supine Shoulder External Rotation AAROM with Dowel (45 * scaption)     n+ HEP 05/08/22   Shoulder Pendulum with Table Support :Flexion-Extension   Horizontal   Circular     n+ HEP 05/08/22   Isometric Shoulder Flexion at Wall     n+ HEP 05/08/22    Isometric Shoulder  Extension at [employer]      n + HEP 05/08/22   Hooklying anterior chest stretch:   Static with larger pool noodle X5 min   n   MFR: R UT, supra, levator, infrap     n   Sidelying R chicken wing x20 AROM only n   Sidelying: RUE   -- flexion  --abd  --ER X10  X10  x10 AROM  AROM  AROM Y  Y  y    AROM within available range for flexion, scaption and abduction  X15 AROM  n + Verbal HEP   Supine Pec stretch     No    Shoulder External Rotation Reactive Isometrics  x15 Yellow n + Hep 06/09/22   Shoulder Internal Rotation Reactive Isometrics   x15 Yellow n+ HEP 06/09/22   Shoulder Extension Reactive Isometrics with Elbow at 90  x15 Yellow n + HEP 06/09/22   Standing Shoulder Flexion Reactive Isometric   x15 Yellow n + HEP 06/09/22          Assessment:  the pt has not progressed with therapy after a 3 month intervention. PE reveals notable weakness of hand intrinsic mm of 5th digit, notable weakness of biceps and significant tricep weakness. The pt has GH capsular tightness but PE does not suggest adhesive capsulitis. She has rotator cuff weakness .  short term goals (5 weeks):               1. Patient to decrease pain to <2/10 at worst with (NOT MET 05/24/22)               2. Pt to improve AROM R shoulder flexion to 170 degrees  (NOT MET 05/24/22)               3. Patient to improve AROM of R shoulder abduction to 165 degrees (NOT MET 05/24/22)                4. Patient to improve R grip strength equivalent to L grip strength to aid in ADL's (NOT MET 05/24/22)               5. Patient to ambulate without guarding of R shoulder in ADD/IR with normal arm swing noted. (NOT MET 05/24/22)     Long term goals (10weeks):               1. Patient to improve PSFS to >7/10 indicating functional improvement ( PROGRESSING 05/24/22)               2. Patient to improve proximal stability of shoulder girdle 4/5 to aid in occupational demands. (NOT MET 05/24/22)               3. Patient to return to occupational demands restriction free (NOT MET 05/24/22)               4. Patient to improve function IR/ER of R shoulder to allow patient to complete ADL's/IADL's without restriction   (NOT MET 05/24/22)       Plan: the pt's therapy is on hold until further work up as order by MD.The pt agrees to call us and inform us of updated POC once her MD directs POC.     Total Session Time 36 and Timed code minutes 36  THERAPEUTIC EXERCISE 20 minutes and JOINT MOBILIZATION/MFR 16 minutes      Lunette Stands, PT  06/29/2022, 1600

## 2022-07-19 IMAGING — MR MRI CERVICAL SPINE WITHOUT CONTRAST
7 series · 28 of 48 positions shown · IV contrast (gadolinium)
Comparison: C-spine x-ray dated 06/15/2022.

﻿EXAM:  10393   MRI CERVICAL SPINE WITHOUT CONTRAST
INDICATION: Neck pain. Right arm weakness. Previous C-spine surgery at C5-6 level.
TECHNIQUE: Multiplanar, multisequential MRI of the C-spine was performed without gadolinium contrast.

[Series 4: s-map · sagittal · 8.8mm · 4.38mm/px · 8 of 100 slices shown]
[im 4/100]
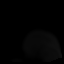
[im 16/100]
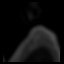
[im 32/100]
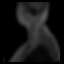
[im 44/100]
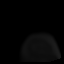
[im 56/100]
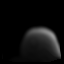
[im 68/100]
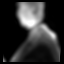
[im 84/100]
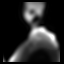
[im 96/100]
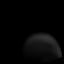

[Series 5: T2 · sagittal · 3.0mm · 0.75mm/px · 3 of 13 slices shown (1 of 2)]
[im 1/13]
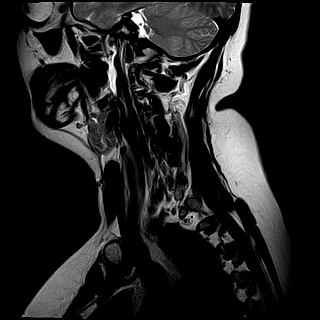
[im 7/13]
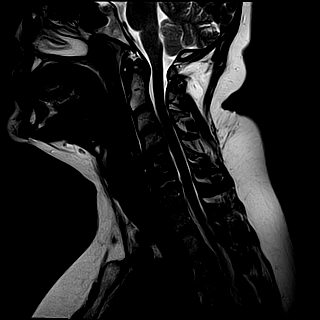
[im 13/13]
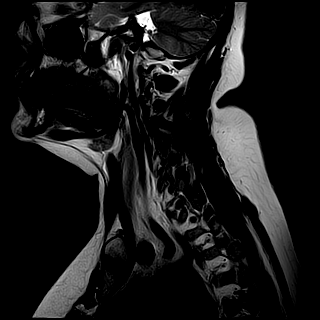

[Series 6: T1 · sagittal · 3.0mm · 0.47mm/px · 3 of 13 slices shown (1 of 2)]
[im 1/13]
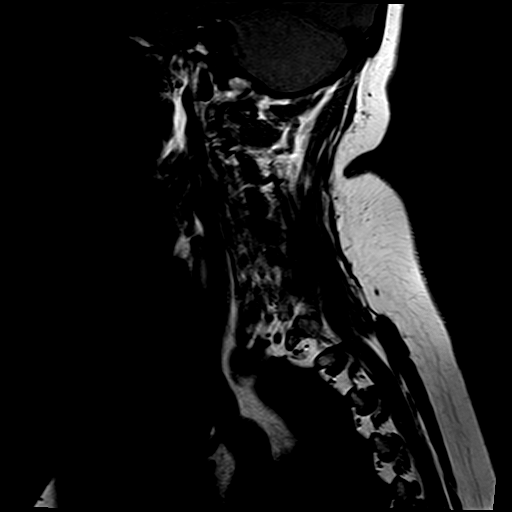
[im 7/13]
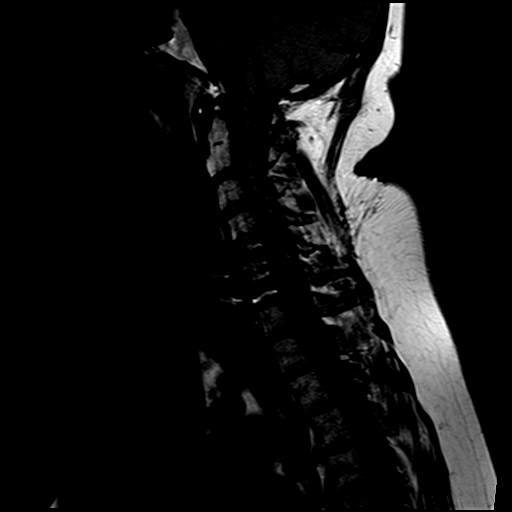
[im 13/13]
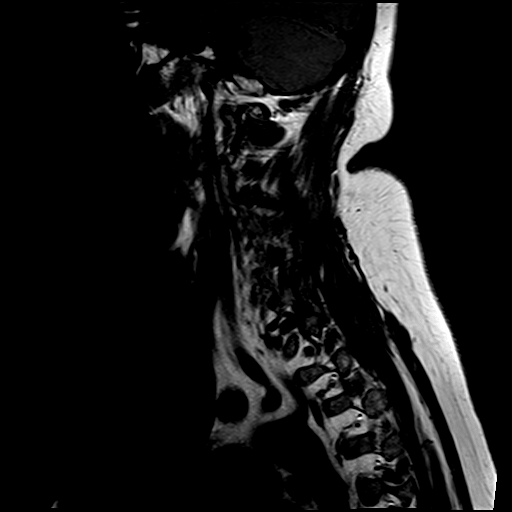

[Series 7: STIR · sagittal · 3.0mm · 0.47mm/px · 3 of 13 slices shown]
[im 1/13]
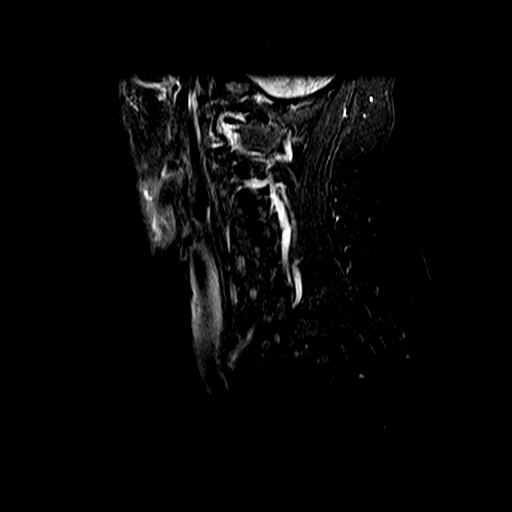
[im 7/13]
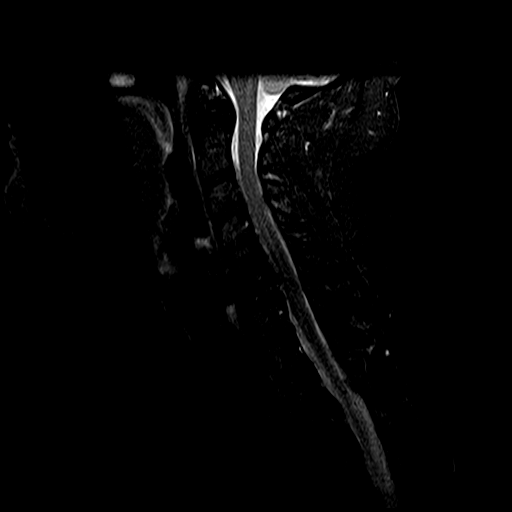
[im 13/13]
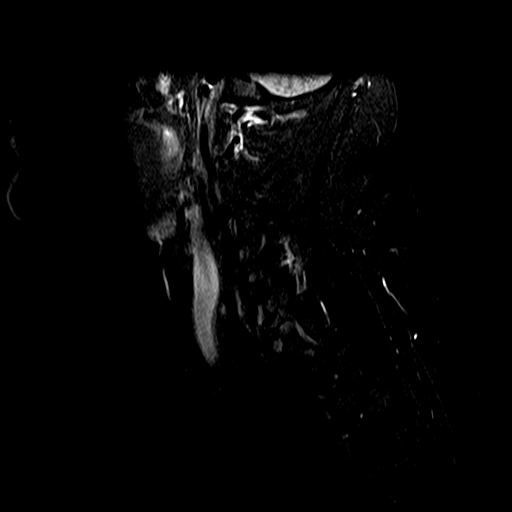

[Series 9: T2-star · axial · 3.0mm · 0.39mm/px · z∈[-78,-40]mm · 3 of 18 slices shown]
[im 1/18]
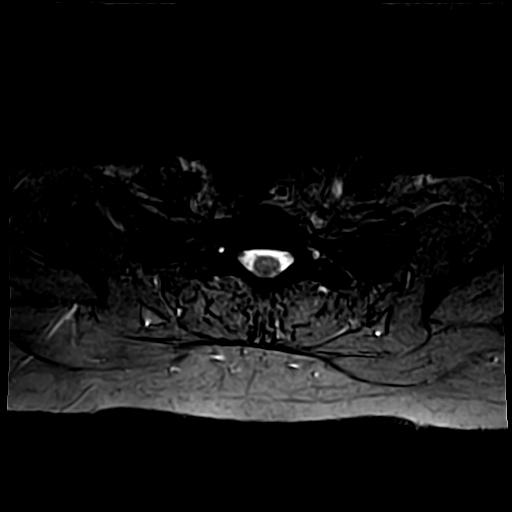
[im 5/18]
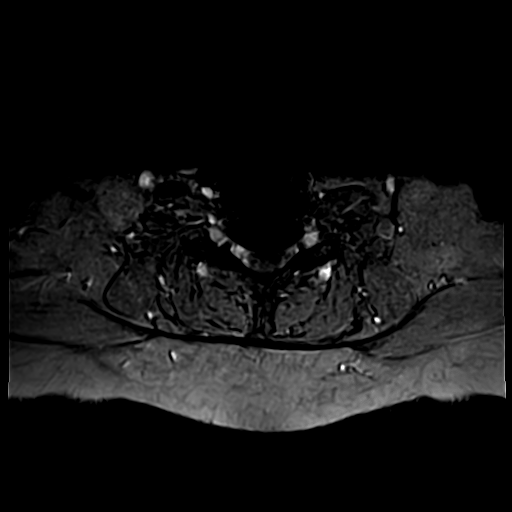
[im 9/18]
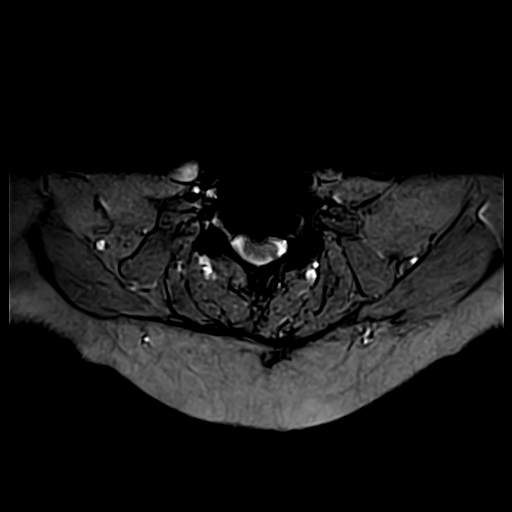

[Series 10: T2 · axial · 3.0mm · 0.39mm/px · z∈[-78,+0]mm · 5 of 18 slices shown (2 of 2)]
[im 1/18]
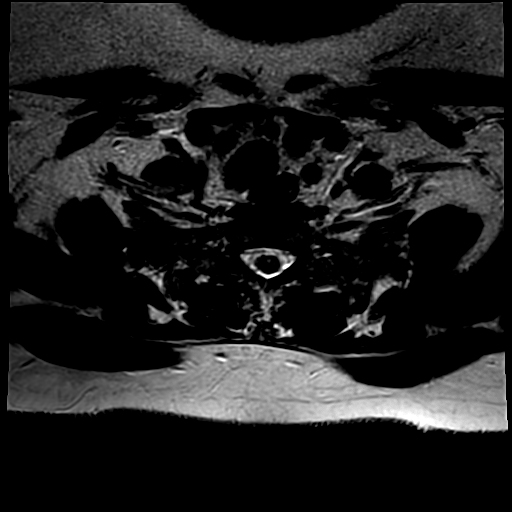
[im 5/18]
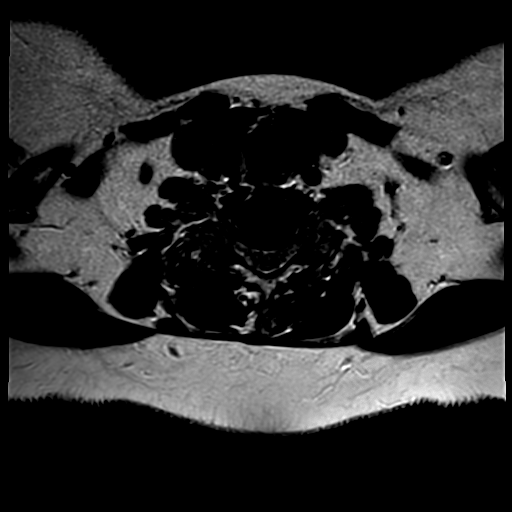
[im 9/18]
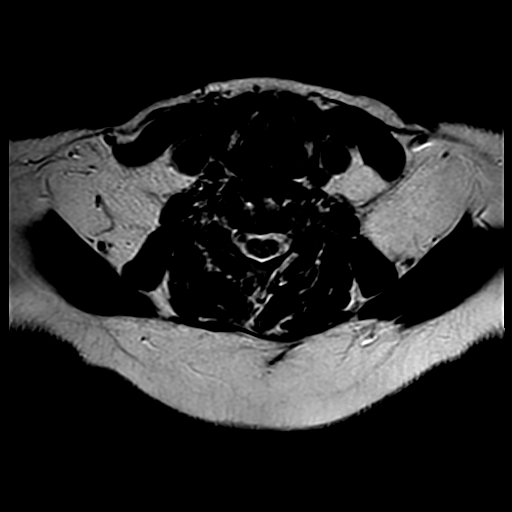
[im 13/18]
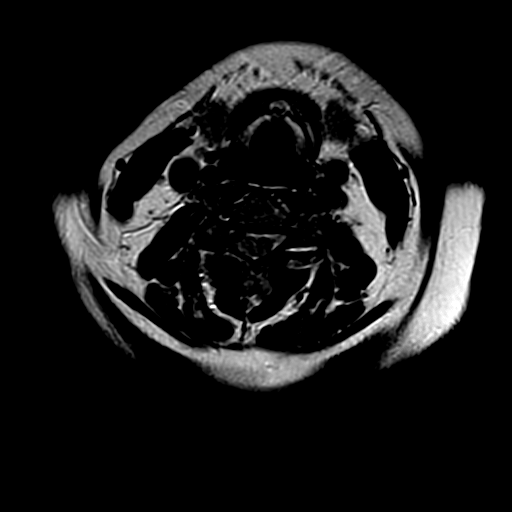
[im 18/18]
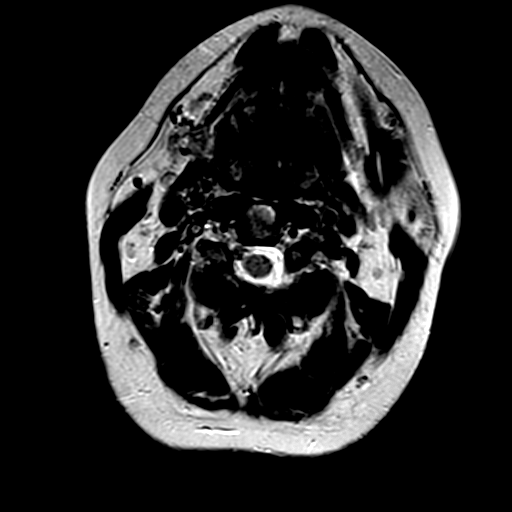

[Series 11: T1 · sagittal · 3.0mm · 0.47mm/px · 3 of 13 slices shown (2 of 2)]
[im 1/13]
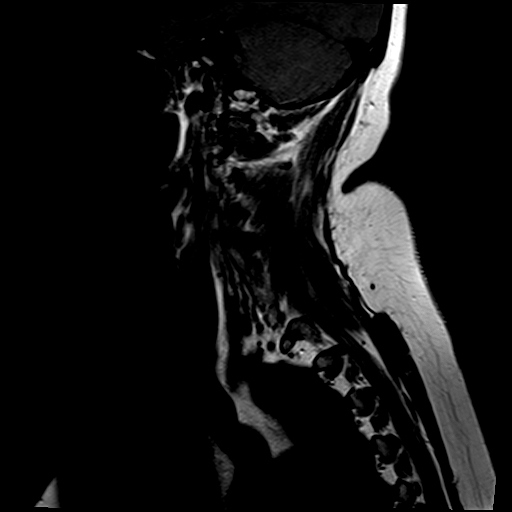
[im 7/13]
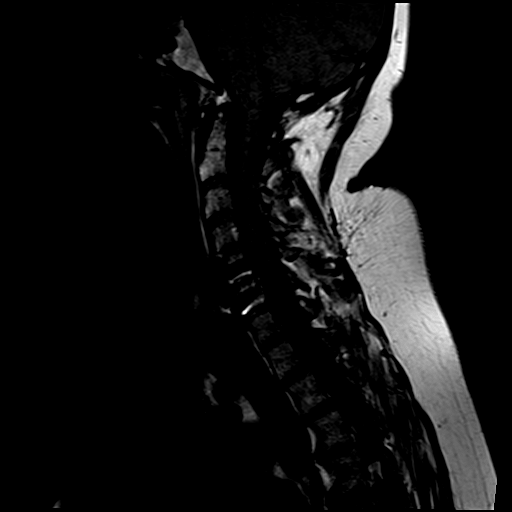
[im 13/13]
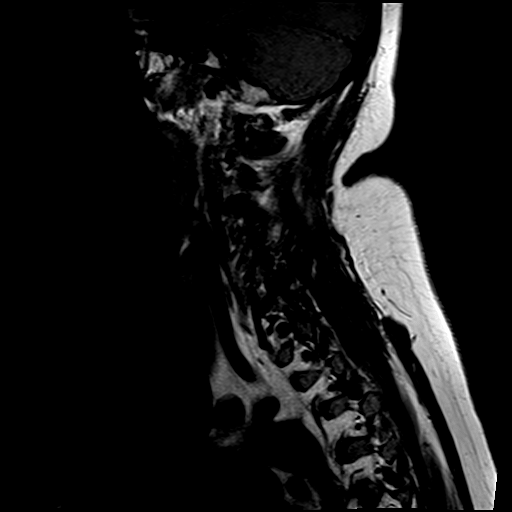

[28 of 48 positions shown; findings below may reference images not displayed]

FINDINGS: No acute bony lesions of cervical vertebrae are seen. Structures at the foramen magnum are normal in the sagittal projection. 

At C2-3 level, no focal disc lesions are seen.

At C3-4 level, mild bulging annulus due to degenerative disc disease, asymmetrically to the left is causing mild-to-moderate left foraminal narrowing.  Mild compromise of thecal sac in the midline with AP diameter measuring 7.1 mm.

At C4-5 level, degenerative disc disease with bulging annulus is causing moderate compromise of right neural foramen.

Postsurgical changes at C5-6 level with anterior fixation hardware.  Visualization at C6-C7 disc level is compromised by the artifacts from fixation hardware.

C7-T1 disc is normal. 

Cervical spinal cord to the extent visualized shows no focal abnormalities.  Paravertebral soft tissues are unremarkable.
IMPRESSION: 1. Postsurgical changes with anterior fixation hardware at C5-6 level.  Significantly limited visualization due to artifacts at C5-6, C6-7 levels.

2. Findings at other disc levels are described above in detail at each level.

3. No focal lesions of cervical spinal cord are seen.

## 2022-09-01 ENCOUNTER — Ambulatory Visit (INDEPENDENT_AMBULATORY_CARE_PROVIDER_SITE_OTHER): Payer: Self-pay

## 2022-09-20 ENCOUNTER — Other Ambulatory Visit (HOSPITAL_COMMUNITY): Payer: Self-pay

## 2022-09-20 DIAGNOSIS — S139XXA Sprain of joints and ligaments of unspecified parts of neck, initial encounter: Secondary | ICD-10-CM

## 2022-09-27 ENCOUNTER — Ambulatory Visit (HOSPITAL_COMMUNITY): Payer: Self-pay

## 2022-10-04 IMAGING — MG 3D SCREENING MAMMO BIL AND TOMO
5 series · 7 of 24 positions shown · non-contrast
Comparison: Mammograms dated 10/02/2023, 05/04/2021.

------------- REPORT [id number] -------------
﻿

EXAM:  3D SCREENING MAMMO BIL AND TOMO
INDICATION: Screening mammogram.  Asymptomatic 52-year-old with family history in her grandmother.  Lifetime breast cancer risk 7.6%.

[L]
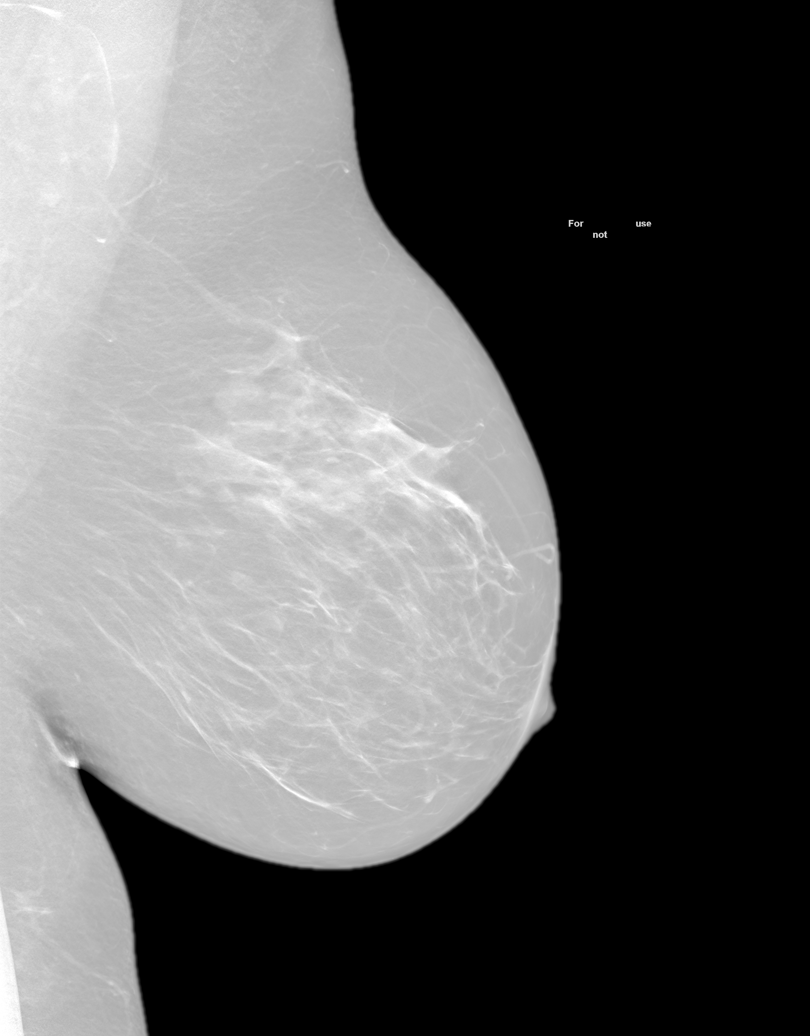

[R]
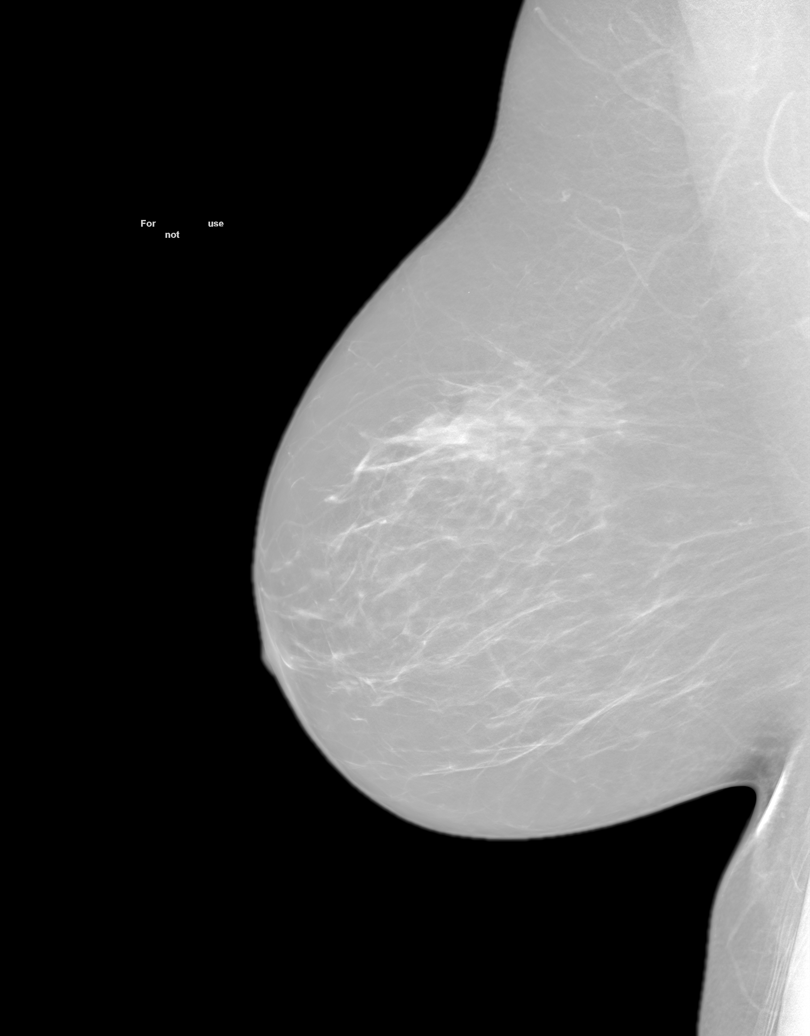

[R CC tomo · right · 0.10mm/px · 2 of 2 slices shown]
[im 1/2]
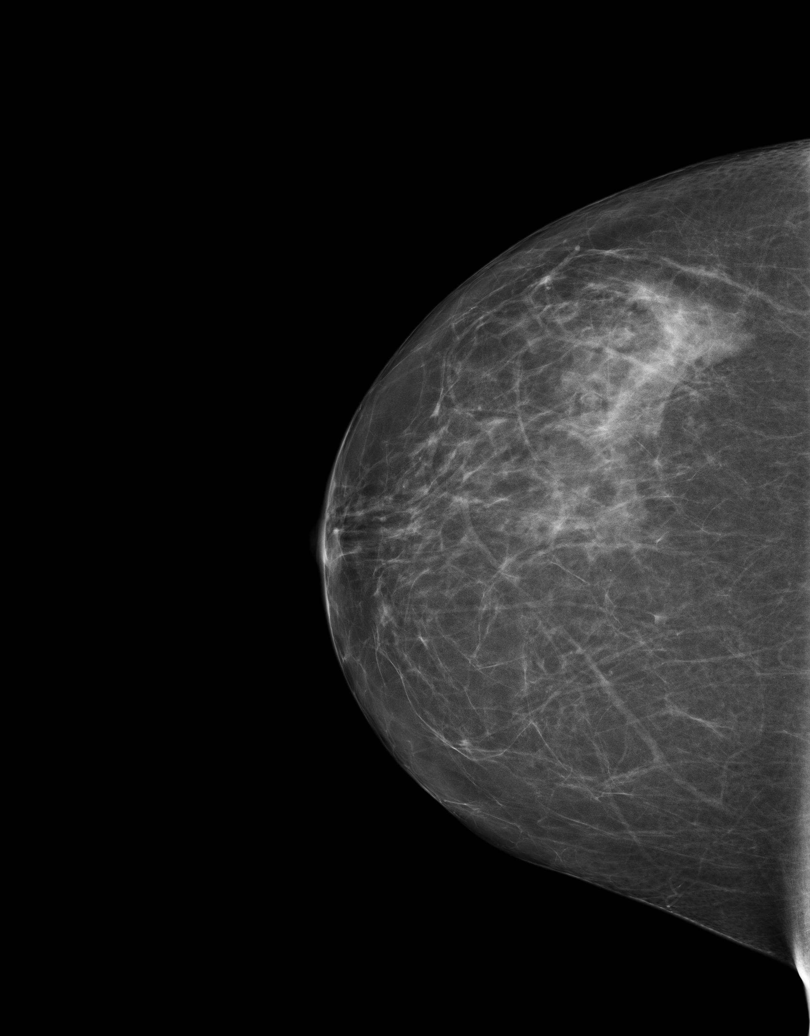
[im 2/2]
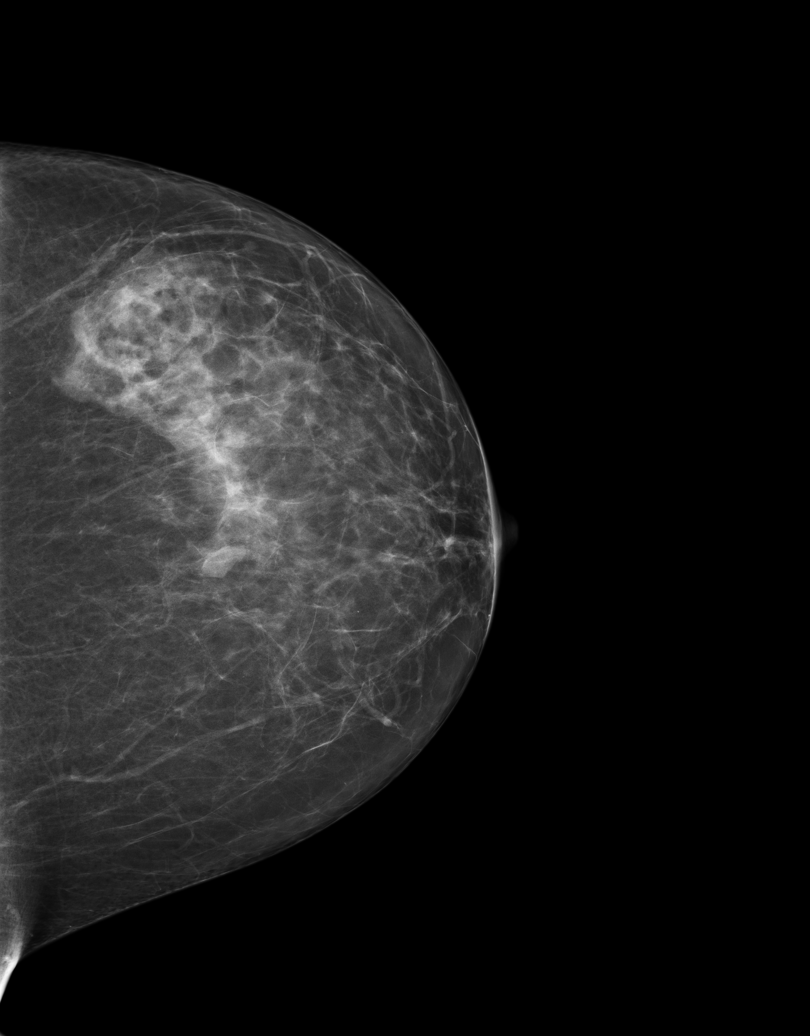

[3D SCREENING MAMMO BIL AND TOMO tomo · 2 acquisitions, 2 frames shown (1 of 2)]
[im 1/2]
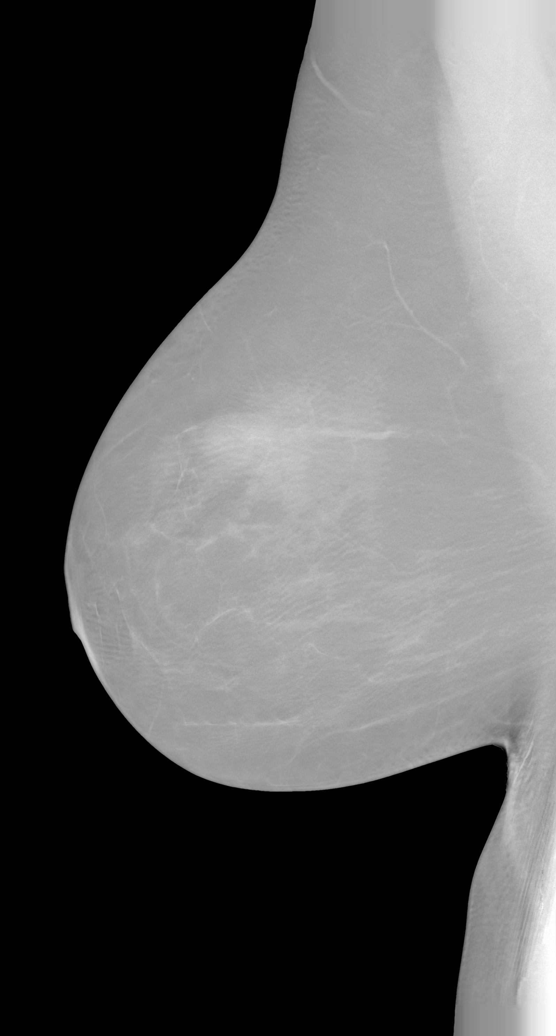
[im 2/2]
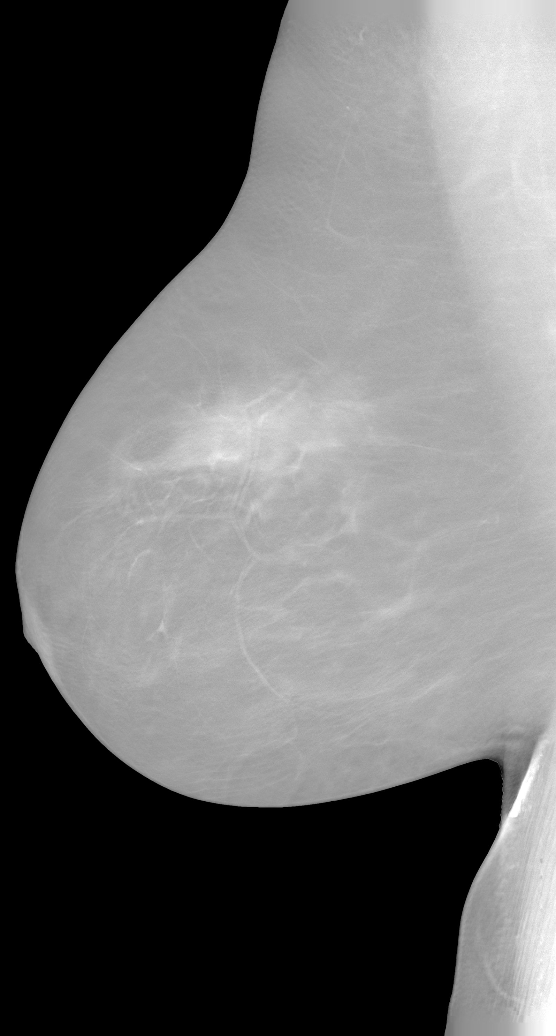

[3D SCREENING MAMMO BIL AND TOMO tomo (2 of 2) · tomo slice 15/94.0]
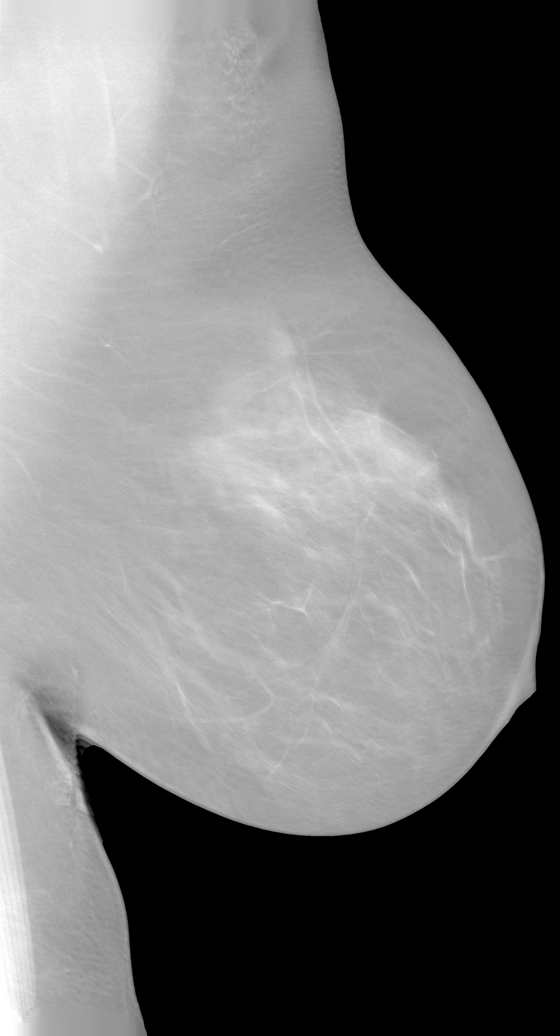

[7 of 24 positions shown; findings below may reference images not displayed]

FINDINGS: Breast parenchyma is heterogeneously dense.  There is no mass or suspicious cluster of microcalcifications.  There is no architectural distortion, skin thickening or nipple retraction.
IMPRESSION: 1.  BIRADS 2-Benign findings. Patient has been added in a reminder system with a target date for the next screening mammography.

2.  DENSITY CODE –  C (Heterogeneously dense) 

Final Assessment Code:

BI-RADS 0
 Need additional imaging evaluation.

BI-RADS 1
 Negative mammogram.

BI-RADS 2
 Benign finding.

BI-RADS 3
 Probably benign finding; short-interval follow-up suggested.

BI-RADS 4
 Suspicious abnormality; biopsy should be considered.

BI-RADS 5
 Highly suggestive of malignancy; appropriate action should be taken.

BI-RADS 6
 Known biopsy-proven malignancy; appropriate action should be taken.

NOTE:
In compliance with Federal regulations, the results of this mammogram are being sent to the patient.

------------- REPORT [id number] -------------
[facility]
[address]
We wish to report the following on your recent mammography examination. We are sending a report to your referring physician or other health care provider.
FINDING: Normal/Negative-No evidence of cancer.

This statement is mandated by the [place], Department of Health.
Your examination was performed by one of our technologists, who are registered radiological technologists and also specially certified in mammography:
___
Tawanda, Kangeta (M)

Your mammogram was interpreted by our radiologist.

( 
Kuninori Sisido, M.D.

(Annual Breast Examination by a physician or other health care provider
(Annual Mammography Screening beginning at age 40
(Monthly Breast Self Examination

## 2022-10-16 ENCOUNTER — Inpatient Hospital Stay
Admission: RE | Admit: 2022-10-16 | Discharge: 2022-10-16 | Disposition: A | Discharge status: Home / Self Care | Payer: Worker's Comp, Other unspecified | Source: Ambulatory Visit

## 2022-10-16 ENCOUNTER — Other Ambulatory Visit: Payer: Self-pay

## 2022-10-16 DIAGNOSIS — S139XXA Sprain of joints and ligaments of unspecified parts of neck, initial encounter: Secondary | ICD-10-CM | POA: Insufficient documentation

## 2024-05-21 ENCOUNTER — Ambulatory Visit (INDEPENDENT_AMBULATORY_CARE_PROVIDER_SITE_OTHER): Payer: Self-pay | Admitting: Ophthalmology

## 2024-05-28 ENCOUNTER — Ambulatory Visit (INDEPENDENT_AMBULATORY_CARE_PROVIDER_SITE_OTHER): Admitting: Ophthalmology
